# Patient Record
Sex: Male | Born: 1950
Health system: Southern US, Community
[De-identification: ages and names within clinical notes are randomized; demographics above are authoritative.]

## PROBLEM LIST (undated history)

## (undated) DIAGNOSIS — M19011 Primary osteoarthritis, right shoulder: Secondary | ICD-10-CM

## (undated) DIAGNOSIS — J189 Pneumonia, unspecified organism: Secondary | ICD-10-CM

## (undated) DIAGNOSIS — R51 Headache: Secondary | ICD-10-CM

## (undated) DIAGNOSIS — M1990 Unspecified osteoarthritis, unspecified site: Secondary | ICD-10-CM

## (undated) DIAGNOSIS — K219 Gastro-esophageal reflux disease without esophagitis: Secondary | ICD-10-CM

## (undated) DIAGNOSIS — J40 Bronchitis, not specified as acute or chronic: Secondary | ICD-10-CM

## (undated) DIAGNOSIS — Z974 Presence of external hearing-aid: Secondary | ICD-10-CM

## (undated) DIAGNOSIS — G479 Sleep disorder, unspecified: Secondary | ICD-10-CM

## (undated) DIAGNOSIS — G4730 Sleep apnea, unspecified: Secondary | ICD-10-CM

## (undated) DIAGNOSIS — I10 Essential (primary) hypertension: Secondary | ICD-10-CM

## (undated) DIAGNOSIS — C801 Malignant (primary) neoplasm, unspecified: Secondary | ICD-10-CM

## (undated) DIAGNOSIS — Z9289 Personal history of other medical treatment: Secondary | ICD-10-CM

## (undated) DIAGNOSIS — H9190 Unspecified hearing loss, unspecified ear: Secondary | ICD-10-CM

## (undated) HISTORY — PX: CERVICAL DISC SURGERY: SHX588

## (undated) HISTORY — PX: TONSILLECTOMY: SUR1361

## (undated) HISTORY — PX: EYE SURGERY: SHX253

## (undated) HISTORY — PX: HERNIA REPAIR: SHX51

---

## 1998-02-12 DIAGNOSIS — Z9289 Personal history of other medical treatment: Secondary | ICD-10-CM

## 1998-02-12 HISTORY — DX: Personal history of other medical treatment: Z92.89

## 1998-05-16 ENCOUNTER — Ambulatory Visit (HOSPITAL_COMMUNITY): Admission: RE | Admit: 1998-05-16 | Discharge: 1998-05-16 | Payer: Self-pay | Admitting: Internal Medicine

## 1998-05-16 ENCOUNTER — Encounter: Payer: Self-pay | Admitting: Internal Medicine

## 2000-07-19 ENCOUNTER — Ambulatory Visit (HOSPITAL_COMMUNITY): Admission: RE | Admit: 2000-07-19 | Discharge: 2000-07-19 | Payer: Self-pay | Admitting: Gastroenterology

## 2002-07-21 ENCOUNTER — Encounter: Admission: RE | Admit: 2002-07-21 | Discharge: 2002-07-21 | Payer: Self-pay | Admitting: Internal Medicine

## 2002-07-21 ENCOUNTER — Encounter: Payer: Self-pay | Admitting: Internal Medicine

## 2003-11-25 ENCOUNTER — Encounter (INDEPENDENT_AMBULATORY_CARE_PROVIDER_SITE_OTHER): Payer: Self-pay | Admitting: Specialist

## 2003-11-25 ENCOUNTER — Ambulatory Visit (HOSPITAL_COMMUNITY): Admission: RE | Admit: 2003-11-25 | Discharge: 2003-11-25 | Payer: Self-pay | Admitting: General Surgery

## 2003-11-25 ENCOUNTER — Ambulatory Visit (HOSPITAL_BASED_OUTPATIENT_CLINIC_OR_DEPARTMENT_OTHER): Admission: RE | Admit: 2003-11-25 | Discharge: 2003-11-25 | Payer: Self-pay | Admitting: General Surgery

## 2004-02-13 DIAGNOSIS — G4730 Sleep apnea, unspecified: Secondary | ICD-10-CM

## 2004-02-13 HISTORY — DX: Sleep apnea, unspecified: G47.30

## 2007-02-13 HISTORY — PX: SHOULDER ARTHROSCOPY: SHX128

## 2008-02-13 HISTORY — PX: SHOULDER ARTHROSCOPY: SHX128

## 2008-09-20 ENCOUNTER — Ambulatory Visit (HOSPITAL_BASED_OUTPATIENT_CLINIC_OR_DEPARTMENT_OTHER): Admission: RE | Admit: 2008-09-20 | Discharge: 2008-09-20 | Payer: Self-pay | Admitting: Plastic Surgery

## 2008-09-20 ENCOUNTER — Encounter (INDEPENDENT_AMBULATORY_CARE_PROVIDER_SITE_OTHER): Payer: Self-pay | Admitting: Plastic Surgery

## 2009-02-12 HISTORY — PX: CATARACT EXTRACTION: SUR2

## 2009-04-21 ENCOUNTER — Inpatient Hospital Stay (HOSPITAL_COMMUNITY): Admission: RE | Admit: 2009-04-21 | Discharge: 2009-04-22 | Payer: Self-pay | Admitting: *Deleted

## 2010-05-08 LAB — DIFFERENTIAL
Basophils Absolute: 0 10*3/uL (ref 0.0–0.1)
Eosinophils Absolute: 0.1 10*3/uL (ref 0.0–0.7)
Monocytes Absolute: 0.3 10*3/uL (ref 0.1–1.0)
Monocytes Relative: 9 % (ref 3–12)

## 2010-05-08 LAB — COMPREHENSIVE METABOLIC PANEL
ALT: 39 U/L (ref 0–53)
AST: 33 U/L (ref 0–37)
Albumin: 4.3 g/dL (ref 3.5–5.2)
BUN: 25 mg/dL — ABNORMAL HIGH (ref 6–23)
Calcium: 9.5 mg/dL (ref 8.4–10.5)
Creatinine, Ser: 1.22 mg/dL (ref 0.4–1.5)
Total Protein: 6.7 g/dL (ref 6.0–8.3)

## 2010-05-08 LAB — URINALYSIS, ROUTINE W REFLEX MICROSCOPIC
Glucose, UA: NEGATIVE mg/dL
Hgb urine dipstick: NEGATIVE
Protein, ur: NEGATIVE mg/dL
Urobilinogen, UA: 1 mg/dL (ref 0.0–1.0)

## 2010-05-08 LAB — TYPE AND SCREEN

## 2010-05-08 LAB — CBC
Hemoglobin: 15.9 g/dL (ref 13.0–17.0)
MCHC: 34.8 g/dL (ref 30.0–36.0)
MCV: 93.2 fL (ref 78.0–100.0)
RDW: 12.3 % (ref 11.5–15.5)

## 2010-05-20 LAB — BASIC METABOLIC PANEL
BUN: 24 mg/dL — ABNORMAL HIGH (ref 6–23)
Calcium: 9.9 mg/dL (ref 8.4–10.5)
Chloride: 104 mEq/L (ref 96–112)
Creatinine, Ser: 1.51 mg/dL — ABNORMAL HIGH (ref 0.4–1.5)
GFR calc non Af Amer: 48 mL/min — ABNORMAL LOW (ref >60)
Glucose, Bld: 110 mg/dL — ABNORMAL HIGH (ref 70–99)
Potassium: 4.1 mEq/L (ref 3.5–5.1)
Sodium: 138 mEq/L (ref 135–145)

## 2010-05-20 LAB — POCT HEMOGLOBIN-HEMACUE: Hemoglobin: 15.7 g/dL (ref 13.0–17.0)

## 2010-06-27 NOTE — Op Note (Signed)
NAME:  Travis Arnold, Travis Arnold                ACCOUNT NO.:  [id number]   MEDICAL RECORD NO.:  [id number]          PATIENT TYPE:  AMB   LOCATION:  DSC                          FACILITY:  [facility]   PHYSICIAN:  Loreta Ave, MD DATE OF BIRTH:  [birth year]   DATE OF PROCEDURE:  09/20/2008  DATE OF DISCHARGE:                               OPERATIVE REPORT   PREOPERATIVE DIAGNOSIS:  Basal cell carcinoma, left forehead.   POSTOPERATIVE DIAGNOSIS:  Basal cell carcinoma, left forehead.   PROCEDURE PERFORMED:  Excision of left brow basal cell carcinoma with  complex closure.   SURGEON:  Loreta Ave, MD   ASSISTANT:  None.   SPECIMENS:  Left forehead lesion to pathology.   ESTIMATED BLOOD LOSS:  Minimal.   COMPLICATIONS:  None.   DRAINS:  None.   CLINICAL INDICATIONS:  Travis Arnold is a 60 year old Caucasian male who  has a biopsy-proven basal cell carcinoma of the left brow.  He presents  at this time for definitive wide local excision.   Travis Arnold understands the risks of surgery to include bleeding, infection,  damage to the nearby structures including the temporal artery and the  frontal branch of the seventh cranial nerve.  He also understands that  this tumor may recur and margins may be positive upon initial resection.  He desires to proceed with surgical excision.   DESCRIPTION OF OPERATION:  The patient was brought to the operating  room, placed in the supine position on the operating room table.  After  smooth and routine induction of general anesthesia with laryngeal mask  airway, the patient's left face and forehead were prepped with Betadine  and draped into a sterile field.  The basal cell carcinoma was measured  as being 1.3 x 1.0 cm.  The 4 mm margins were designed around this to  excise lesion 2.1 x 1.8 cm.  A fusiform shape was designed over the  lateral aspect of the left brow parallel to the relaxed skin tension  lines of the brow.  The skin was incised with a 15  blade and the skin  was elevated off of the subcutaneous tissues with a 15 blade.  Care was  taken not to injure any deeper structures.  The lesion was marked with a  silk suture and passed off the table labeled left brow lesion.  Next,  the wound edges were differentially undermined.   The superior and lateral skin flaps were elevated with blunt dissection  for 5-6 cm moving superiorly.  This was done to allow descent of the  superior brow without elevating the lateral canthus or the eyebrow.  Next, the wound was irrigated with normal saline.  Next, 8 mL of 0.5%  bupivacaine with 1:100,000 epinephrine were used for postoperative  analgesia.  Hemostasis was verified with bipolar electrocautery.  The  wound was then closed with  buried, interrupted 5-0 Monocryl sutures and a running 6-0 Prolene  stitch.  Polysporin was applied to the incision.  The patient was then  extubated and transferred to the recovery room in stable condition.  Sponge and all counts were reported  as correct.      Loreta Ave, MD  Electronically Signed     Loreta Ave, MD  Electronically Signed    CF/MEDQ  D:  09/20/2008  T:  09/21/2008  Job:  [id number]

## 2010-06-30 NOTE — Op Note (Signed)
NAME:  Travis Arnold, RADEMAKER                ACCOUNT NO.:  [id number]   MEDICAL RECORD NO.:  [id number]          PATIENT TYPE:  AMB   LOCATION:  DSC                          FACILITY:  [facility]   PHYSICIAN:  Ollen Gross. Vernell Morgans, M.D. DATE OF BIRTH:  Apr 10, 1950   DATE OF PROCEDURE:  11/29/2003  DATE OF DISCHARGE:  11/25/2003                                 OPERATIVE REPORT   PREOPERATIVE DIAGNOSES:  Skin lesion on the left forearm and upper back.   POSTOPERATIVE DIAGNOSES:  Skin lesion on the left forearm and upper back.   OPERATION PERFORMED:  Excision of 2 mm lesion from left forearm and 2 cm  lesion from upper back.   SURGEON:  Ollen Gross. Carolynne Edouard, M.D.   ANESTHESIA:  Local.   DESCRIPTION OF PROCEDURE:  After informed consent was obtained, the patient  was brought to the operating room and placed in supine position on the  operating table.  The patient's left arm was then prepped with Betadine and  draped in the usual sterile manner.  The area in question was infiltrated  with 1% lidocaine with epinephrine.  The skin lesion was approximately 2 mm  and was excised in elliptical fashion with a longitudinal type incision.  Once the pass was excised sharply with a 15 blade knife, the incision was  closed with one interrupted 5-0 Monocryl subcuticular stitch.  Benzoin,  Steri-Strips and sterile dressings were applied.  Next, the patient was  moved into a prone position and the area on his upper back was the prepped  with Betadine and draped in the usual sterile manner.  The area was  infiltrated with 1% lidocaine until a good field block was created.  The  area was then excised in an elliptical fashion transversely until the entire  lesion was removed.  This was done sharply full thickness with a 15 blade  knife. Once the lesion was removed, it was oriented and sent to pathology  for further evaluation.  The defect was quite large and the skin edges were  undermined slightly with Metzenbaum scissors.  The  wound was then able to be  closed with a combination of 2-0 nylon vertical mattress stitches as well as  2-0 nylon simple interrupted stitches.  Once this was done, the wound was  coated with Neosporin and sterile dressings were applied.  The patient  tolerated the procedure well.  Hemostasis was achieved using the eye  cautery.  The patient was then taken to recovery room in stable condition.       PST/MEDQ  D:  11/29/2003  T:  11/29/2003  Job:  [id number]

## 2010-11-07 ENCOUNTER — Other Ambulatory Visit: Payer: Self-pay | Admitting: Gastroenterology

## 2010-11-07 DIAGNOSIS — R1012 Left upper quadrant pain: Secondary | ICD-10-CM

## 2010-11-09 ENCOUNTER — Ambulatory Visit: Admission: RE | Admit: 2010-11-09 | Payer: Medicare Other | Source: Ambulatory Visit

## 2010-11-09 ENCOUNTER — Ambulatory Visit
Admission: RE | Admit: 2010-11-09 | Discharge: 2010-11-09 | Disposition: A | Discharge status: Home / Self Care | Payer: Medicare Other | Source: Ambulatory Visit | Attending: Gastroenterology | Admitting: Gastroenterology

## 2010-11-09 ENCOUNTER — Other Ambulatory Visit: Payer: Self-pay | Admitting: Gastroenterology

## 2010-11-09 DIAGNOSIS — R1012 Left upper quadrant pain: Secondary | ICD-10-CM

## 2010-11-09 MED ORDER — IOHEXOL 300 MG/ML  SOLN: 100.0000 mL | Freq: Once | INTRAMUSCULAR | Status: AC | PRN

## 2012-05-13 ENCOUNTER — Other Ambulatory Visit: Payer: Self-pay | Admitting: Orthopedic Surgery

## 2012-05-14 ENCOUNTER — Other Ambulatory Visit: Payer: Self-pay | Admitting: Orthopedic Surgery

## 2012-05-14 DIAGNOSIS — M542 Cervicalgia: Secondary | ICD-10-CM

## 2012-05-30 ENCOUNTER — Ambulatory Visit
Admission: RE | Admit: 2012-05-30 | Discharge: 2012-05-30 | Disposition: A | Discharge status: Home / Self Care | Payer: 59 | Source: Ambulatory Visit | Attending: Orthopedic Surgery | Admitting: Orthopedic Surgery

## 2012-05-30 VITALS — BP 147/99 | HR 68

## 2012-05-30 DIAGNOSIS — M542 Cervicalgia: Secondary | ICD-10-CM

## 2012-05-30 MED ORDER — DIAZEPAM 5 MG PO TABS
10.0000 mg | ORAL_TABLET | Freq: Once | ORAL | Status: AC
  Administered 2012-05-30: 10 mg via ORAL

## 2012-05-30 MED ORDER — IOHEXOL 300 MG/ML  SOLN
10.0000 mL | Freq: Once | INTRAMUSCULAR | Status: AC | PRN
  Administered 2012-05-30: 10 mL via INTRATHECAL

## 2012-05-30 NOTE — Progress Notes (Addendum)
Patient states he has been off Tramadol (and all his other medications) since "last Wednesday."  Willaim Sheng here to interpret/sign discharge instructions.  Donell Sievert, RN

## 2012-06-12 ENCOUNTER — Other Ambulatory Visit: Payer: Self-pay | Admitting: Dermatology

## 2012-08-28 ENCOUNTER — Other Ambulatory Visit: Payer: Self-pay | Admitting: Orthopedic Surgery

## 2012-09-08 ENCOUNTER — Encounter (HOSPITAL_COMMUNITY): Payer: Self-pay | Admitting: Pharmacy Technician

## 2012-09-09 ENCOUNTER — Other Ambulatory Visit: Payer: Self-pay | Admitting: Orthopedic Surgery

## 2012-09-09 NOTE — Pre-Procedure Instructions (Addendum)
Travis Arnold  09/09/2012   Your procedure is scheduled on:  8.6.14  Report to [facility] at 630 AM.  Call this number if you have problems the morning of surgery: [phone]   Remember:   Do not eat food or drink liquids after midnight.   Take these medicines the morning of surgery with A SIP OF WATER: metoprolol, nexium, proscar,pain med,              STOP  Aspirin, meloxicam on 09/12/12   Do not wear jewelry, make-up or nail polish.  Do not wear lotions, powders, or perfumes. You may wear deodorant.  Do not shave 48 hours prior to surgery. Men may shave face and neck.  Do not bring valuables to the hospital.  [facility] is not responsible                   for any belongings or valuables.  Contacts, dentures or bridgework may not be worn into surgery.  Leave suitcase in the car. After surgery it may be brought to your room.  For patients admitted to the hospital, checkout time is 11:00 AM the day of  discharge.   Patients discharged the day of surgery will not be allowed to drive  home.  Name and phone number of your driver:   Special Instructions: Incentive Spirometry - Practice and bring it with you on the day of surgery. Shower using CHG 2 nights before surgery and the night before surgery.  If you shower the day of surgery use CHG.  Use special wash - you have one bottle of CHG for all showers.  You should use approximately 1/3 of the bottle for each shower.   Please read over the following fact sheets that you were given: Pain Booklet, Coughing and Deep Breathing, Blood Transfusion Information, MRSA Information and Surgical Site Infection Prevention

## 2012-09-10 ENCOUNTER — Other Ambulatory Visit: Payer: Self-pay | Admitting: Orthopedic Surgery

## 2012-09-10 ENCOUNTER — Encounter (HOSPITAL_COMMUNITY)
Admission: RE | Admit: 2012-09-10 | Discharge: 2012-09-10 | Disposition: A | Discharge status: Home / Self Care | Payer: Medicare Other | Source: Ambulatory Visit | Attending: Orthopedic Surgery | Admitting: Orthopedic Surgery

## 2012-09-10 ENCOUNTER — Ambulatory Visit (HOSPITAL_COMMUNITY)
Admission: RE | Admit: 2012-09-10 | Discharge: 2012-09-10 | Disposition: A | Discharge status: Home / Self Care | Payer: Medicare Other | Source: Ambulatory Visit | Attending: Orthopedic Surgery | Admitting: Orthopedic Surgery

## 2012-09-10 ENCOUNTER — Encounter (HOSPITAL_COMMUNITY): Payer: Self-pay

## 2012-09-10 DIAGNOSIS — Z01818 Encounter for other preprocedural examination: Secondary | ICD-10-CM | POA: Insufficient documentation

## 2012-09-10 DIAGNOSIS — Z01810 Encounter for preprocedural cardiovascular examination: Secondary | ICD-10-CM | POA: Insufficient documentation

## 2012-09-10 DIAGNOSIS — Z01812 Encounter for preprocedural laboratory examination: Secondary | ICD-10-CM | POA: Insufficient documentation

## 2012-09-10 DIAGNOSIS — J189 Pneumonia, unspecified organism: Secondary | ICD-10-CM | POA: Insufficient documentation

## 2012-09-10 HISTORY — DX: Malignant (primary) neoplasm, unspecified: C80.1

## 2012-09-10 HISTORY — DX: Gastro-esophageal reflux disease without esophagitis: K21.9

## 2012-09-10 HISTORY — DX: Unspecified osteoarthritis, unspecified site: M19.90

## 2012-09-10 HISTORY — DX: Bronchitis, not specified as acute or chronic: J40

## 2012-09-10 HISTORY — DX: Sleep apnea, unspecified: G47.30

## 2012-09-10 HISTORY — DX: Essential (primary) hypertension: I10

## 2012-09-10 LAB — PROTIME-INR: Prothrombin Time: 12.8 seconds (ref 11.6–15.2)

## 2012-09-10 LAB — CBC WITH DIFFERENTIAL/PLATELET
Hemoglobin: 16.1 g/dL (ref 13.0–17.0)
Lymphs Abs: 1.2 10*3/uL (ref 0.7–4.0)
Monocytes Relative: 8 % (ref 3–12)
Neutro Abs: 2.7 10*3/uL (ref 1.7–7.7)
Neutrophils Relative %: 59 % (ref 43–77)
RBC: 4.85 MIL/uL (ref 4.22–5.81)

## 2012-09-10 LAB — COMPREHENSIVE METABOLIC PANEL
Albumin: 4 g/dL (ref 3.5–5.2)
Alkaline Phosphatase: 87 U/L (ref 39–117)
BUN: 24 mg/dL — ABNORMAL HIGH (ref 6–23)
Chloride: 101 mEq/L (ref 96–112)
GFR calc Af Amer: 77 mL/min — ABNORMAL LOW (ref >90)
Glucose, Bld: 94 mg/dL (ref 70–99)
Potassium: 3.9 mEq/L (ref 3.5–5.1)
Total Bilirubin: 0.7 mg/dL (ref 0.3–1.2)

## 2012-09-10 LAB — APTT: aPTT: 26 seconds (ref 24–37)

## 2012-09-10 LAB — URINALYSIS, ROUTINE W REFLEX MICROSCOPIC
Bilirubin Urine: NEGATIVE
Glucose, UA: NEGATIVE mg/dL
Hgb urine dipstick: NEGATIVE
Specific Gravity, Urine: 1.021 (ref 1.005–1.030)
Urobilinogen, UA: 1 mg/dL (ref 0.0–1.0)

## 2012-09-10 LAB — SURGICAL PCR SCREEN: Staphylococcus aureus: NEGATIVE

## 2012-09-10 NOTE — Progress Notes (Signed)
Patient req typ and scn dos. Does not want to wear bracelet. Patient and wife are hearing impaired. Has sign interpretor arranged. expressed concern that wife be with pt as long as possible to assist with communication.

## 2012-09-10 NOTE — Progress Notes (Addendum)
Travis Arnold at dr hewitt's office called with cxr report of bibasilar pneumonias. She stated she will notify dr Yevette Edwards

## 2012-09-17 ENCOUNTER — Encounter (HOSPITAL_COMMUNITY): Admission: RE | Payer: Self-pay | Source: Ambulatory Visit

## 2012-09-17 ENCOUNTER — Inpatient Hospital Stay (HOSPITAL_COMMUNITY): Admission: RE | Admit: 2012-09-17 | Payer: Medicare Other | Source: Ambulatory Visit | Admitting: Orthopedic Surgery

## 2012-09-17 SURGERY — POSTERIOR CERVICAL FUSION/FORAMINOTOMY LEVEL 5
Anesthesia: General

## 2013-01-13 ENCOUNTER — Other Ambulatory Visit: Payer: Self-pay | Admitting: Orthopedic Surgery

## 2013-01-14 ENCOUNTER — Other Ambulatory Visit: Payer: Self-pay | Admitting: Orthopedic Surgery

## 2013-02-04 NOTE — Pre-Procedure Instructions (Signed)
Travis Arnold  02/04/2013   Your procedure is scheduled on: Wednesday  02/19/12   Report to [facility]  2 * 3 at 630 AM.  Call this number if you have problems the morning of surgery: [phone]   Remember:   Do not eat food or drink liquids after midnight.   Take these medicines the morning of surgery with A SIP OF WATER: doxazosin(cardura), nexium, finasteride, metoprolol(lopressor), tramadol (ultram)   Do not wear jewelry  Do not wear lotions, powders, or perfumes. You may wear deodorant.   Men may shave face and neck.  Do not bring valuables to the hospital.  [facility] is not responsible                  for any belongings or valuables.               Contacts, dentures or bridgework may not be worn into surgery.  Leave suitcase in the car. After surgery it may be brought to your room.  For patients admitted to the hospital, discharge time is determined by your                treatment team.               Patients discharged the day of surgery will not be allowed to drive  home.  Name and phone number of your driver: wife - hearing impaired as well   Special Instructions: Shower using CHG 2 nights before surgery and the night before surgery.  If you shower the day of surgery use CHG.  Use special wash - you have one bottle of CHG for all showers.  You should use approximately 1/3 of the bottle for each shower.   Please read over the following fact sheets that you were given: Pain Booklet, Coughing and Deep Breathing and Surgical Site Infection Prevention

## 2013-02-06 ENCOUNTER — Encounter (HOSPITAL_COMMUNITY): Payer: Self-pay

## 2013-02-06 ENCOUNTER — Other Ambulatory Visit: Payer: Self-pay | Admitting: Orthopedic Surgery

## 2013-02-06 ENCOUNTER — Encounter (HOSPITAL_COMMUNITY)
Admission: RE | Admit: 2013-02-06 | Discharge: 2013-02-06 | Disposition: A | Discharge status: Home / Self Care | Payer: Medicare Other | Source: Ambulatory Visit | Attending: Orthopedic Surgery | Admitting: Orthopedic Surgery

## 2013-02-06 DIAGNOSIS — Z01818 Encounter for other preprocedural examination: Secondary | ICD-10-CM | POA: Insufficient documentation

## 2013-02-06 DIAGNOSIS — Z01812 Encounter for preprocedural laboratory examination: Secondary | ICD-10-CM | POA: Insufficient documentation

## 2013-02-06 HISTORY — DX: Sleep disorder, unspecified: G47.9

## 2013-02-06 HISTORY — DX: Headache: R51

## 2013-02-06 HISTORY — DX: Personal history of other medical treatment: Z92.89

## 2013-02-06 LAB — PROTIME-INR: INR: 0.99 (ref 0.00–1.49)

## 2013-02-06 LAB — COMPREHENSIVE METABOLIC PANEL
Albumin: 4 g/dL (ref 3.5–5.2)
Alkaline Phosphatase: 101 U/L (ref 39–117)
BUN: 24 mg/dL — ABNORMAL HIGH (ref 6–23)
CO2: 30 mEq/L (ref 19–32)
Calcium: 9.4 mg/dL (ref 8.4–10.5)
Chloride: 95 mEq/L — ABNORMAL LOW (ref 96–112)
Creatinine, Ser: 0.94 mg/dL (ref 0.50–1.35)
GFR calc Af Amer: >90 mL/min (ref >90)
GFR calc non Af Amer: 87 mL/min — ABNORMAL LOW (ref >90)
Glucose, Bld: 105 mg/dL — ABNORMAL HIGH (ref 70–99)
Total Bilirubin: 0.9 mg/dL (ref 0.3–1.2)

## 2013-02-06 LAB — CBC WITH DIFFERENTIAL/PLATELET
Eosinophils Relative: 4 % (ref 0–5)
HCT: 46.9 % (ref 39.0–52.0)
Hemoglobin: 17.1 g/dL — ABNORMAL HIGH (ref 13.0–17.0)
Lymphocytes Relative: 27 % (ref 12–46)
Lymphs Abs: 1.1 10*3/uL (ref 0.7–4.0)
MCV: 91.2 fL (ref 78.0–100.0)
Monocytes Absolute: 0.6 10*3/uL (ref 0.1–1.0)
Monocytes Relative: 15 % — ABNORMAL HIGH (ref 3–12)
RBC: 5.14 MIL/uL (ref 4.22–5.81)
RDW: 11.6 % (ref 11.5–15.5)
WBC: 4 10*3/uL (ref 4.0–10.5)

## 2013-02-06 LAB — URINALYSIS, ROUTINE W REFLEX MICROSCOPIC
Glucose, UA: NEGATIVE mg/dL
Hgb urine dipstick: NEGATIVE
Ketones, ur: NEGATIVE mg/dL
Leukocytes, UA: NEGATIVE
Nitrite: NEGATIVE
Protein, ur: NEGATIVE mg/dL
Specific Gravity, Urine: 1.025 (ref 1.005–1.030)
Urobilinogen, UA: 1 mg/dL (ref 0.0–1.0)
pH: 6.5 (ref 5.0–8.0)

## 2013-02-06 LAB — SURGICAL PCR SCREEN: MRSA, PCR: NEGATIVE

## 2013-02-06 LAB — TYPE AND SCREEN: ABO/RH(D): O POS

## 2013-02-06 NOTE — Progress Notes (Signed)
02/06/13 1206  OBSTRUCTIVE SLEEP APNEA  Have you ever been diagnosed with sleep apnea through a sleep study? Yes  If yes, do you have and use a CPAP or BPAP machine every night? 0  Do you snore loudly (loud enough to be heard through closed doors)?  1  Do you often feel tired, fatigued, or sleepy during the daytime? 1  Has anyone observed you stop breathing during your sleep? 1  Do you have, or are you being treated for high blood pressure? 1  BMI more than 35 kg/m2? 0  Age over 75 years old? 1  Neck circumference greater than 40 cm/18 inches? 0  Gender: 1  Obstructive Sleep Apnea Score 6  Score 4 or greater  Results sent to PCP

## 2013-02-06 NOTE — Progress Notes (Signed)
Pt. & wife very concerned about their hearing impaired needs being met while pt. Is in patient. They recall a previous experience with inpt. admission, where the nurse couldn't get pt. to wake from a sleep state by audible calling his name, so their recall is that the nurse shook him in the neck, head, shoulder region & he felt that this led to further pain & problems in the neck & shoulder area.   Pt. & spouse verbalizing through interpretor present today, that they need better support than they have experienced in past. Call to care management, no answer, will follow up on Monday- 12/29.

## 2013-02-06 NOTE — Progress Notes (Signed)
Pt. With interpretor, is hearing impaired. Call to C. Katrinka Blazing, left voicemail,  at [employer], asked for operative consent to clarify level of Thoracic  .  Pt. Reports that he has recently had low grade temp, feels flulike, but is feeling better today.

## 2013-02-09 ENCOUNTER — Encounter (HOSPITAL_COMMUNITY): Payer: Self-pay

## 2013-02-09 NOTE — Progress Notes (Signed)
Anesthesia chart review:  Patient is a 62 year old male scheduled for posterior cervical fusion C3-4, C4-5, C5-6, C6-7, C7-T1 on 02/18/13 by Dr. Yevette Edwards.  History includes non-smoker, hearing impaired, HTN, GERD, OSA without current CPAP, arthritis, headaches, tonsillectomy, melanoma, left forehead BCC excision '10, bilateral inguinal hernia repair, C3-5 ACDF '11. PCP is listed as Dr. Kirby Funk.  EKG on 09/10/12 showed SB at 55 bpm, PACs, RSR prime or QR pattern in V1 suggests RV conduction delay.  CHEST 2 VIEW 02/06/13: COMPARISON: 09/10/2012  FINDINGS: The cardiac silhouette remains mildly enlarged. The patient has taken a greater inspiration than on the prior study. There remains mild vascular crowding at the lung bases, however there is improved aeration at the left base with interval resolution of previously described opacity. There is no evidence of new airspace consolidation, edema, pleural effusion, or pneumothorax. No acute osseous abnormality is identified. IMPRESSION: Improved lung inflation with mild residual bibasilar atelectasis.   Preoperative labs noted.  Chart left for Short Stay RN follow-up regarding need for interpretor due to be hearing impaired. Apparently, patient had interpretor with him at his PAT visit, but requested interpretor be arranged while in-patient.  Staff to notify case management.  Also they will need to clarify if he will also need an interpretor pre-operatively so this can be arranged if needed.  If so, they were asked to contact our scheduler Dondra Spry so she can arrange.  Velna Ochs [facility]/Anesthesiology Phone [phone] 02/09/2013 1:23 PM

## 2013-02-10 NOTE — Progress Notes (Signed)
Spoke to [employer] in Office of Inclusion (set up interpretors for [facility] patients).Stated the day of surgery for Travis Arnold a sign language interpretor will arrive at 0630 and will also be available that afternoon starting at 1345 for 2 hours. She stated that the nurses on the floor are aware of making arrangements for interpretors when doctors make rounds. All they have to do is call [employer] [phone] and after hours they can use the sign language emergency number which is [phone].

## 2013-02-17 MED ORDER — CEFAZOLIN SODIUM-DEXTROSE 2-3 GM-% IV SOLR
2.0000 g | INTRAVENOUS | Status: AC
Start: 2013-02-18 — End: 2013-02-18
  Administered 2013-02-18 (×2): 2 g via INTRAVENOUS
  Filled 2013-02-17: qty 50

## 2013-02-18 ENCOUNTER — Inpatient Hospital Stay (HOSPITAL_COMMUNITY): Payer: Medicare Other | Admitting: Anesthesiology

## 2013-02-18 ENCOUNTER — Inpatient Hospital Stay (HOSPITAL_COMMUNITY)
Admission: RE | Admit: 2013-02-18 | Discharge: 2013-02-20 | DRG: 473 | Disposition: A | Discharge status: Home / Self Care | Payer: Medicare Other | Source: Ambulatory Visit | Attending: Orthopedic Surgery | Admitting: Orthopedic Surgery

## 2013-02-18 ENCOUNTER — Encounter (HOSPITAL_COMMUNITY)
Admission: RE | Disposition: A | Discharge status: Home / Self Care | Payer: Medicare Other | Source: Ambulatory Visit | Attending: Orthopedic Surgery

## 2013-02-18 ENCOUNTER — Encounter (HOSPITAL_COMMUNITY): Payer: Medicare Other | Admitting: Vascular Surgery

## 2013-02-18 ENCOUNTER — Inpatient Hospital Stay (HOSPITAL_COMMUNITY): Payer: Medicare Other

## 2013-02-18 ENCOUNTER — Encounter (HOSPITAL_COMMUNITY): Payer: Self-pay | Admitting: Surgery

## 2013-02-18 DIAGNOSIS — M5410 Radiculopathy, site unspecified: Secondary | ICD-10-CM | POA: Diagnosis present

## 2013-02-18 DIAGNOSIS — M47812 Spondylosis without myelopathy or radiculopathy, cervical region: Principal | ICD-10-CM | POA: Diagnosis present

## 2013-02-18 DIAGNOSIS — I10 Essential (primary) hypertension: Secondary | ICD-10-CM | POA: Diagnosis present

## 2013-02-18 DIAGNOSIS — K219 Gastro-esophageal reflux disease without esophagitis: Secondary | ICD-10-CM | POA: Diagnosis present

## 2013-02-18 HISTORY — PX: POSTERIOR CERVICAL FUSION/FORAMINOTOMY: SHX5038

## 2013-02-18 SURGERY — POSTERIOR CERVICAL FUSION/FORAMINOTOMY LEVEL 5
Anesthesia: General | Site: Spine Cervical

## 2013-02-18 MED ORDER — PROPOFOL INFUSION 10 MG/ML OPTIME
INTRAVENOUS | Status: DC | PRN
  Administered 2013-02-18: 25 ug/kg/min via INTRAVENOUS

## 2013-02-18 MED ORDER — MENTHOL 3 MG MT LOZG: 1.0000 | LOZENGE | OROMUCOSAL | Status: DC | PRN

## 2013-02-18 MED ORDER — BUPIVACAINE-EPINEPHRINE (PF) 0.25% -1:200000 IJ SOLN
INTRAMUSCULAR | Status: AC
  Filled 2013-02-18: qty 30

## 2013-02-18 MED ORDER — FINASTERIDE 5 MG PO TABS
5.0000 mg | ORAL_TABLET | Freq: Every morning | ORAL | Status: DC
  Administered 2013-02-19 – 2013-02-20 (×2): 5 mg via ORAL
  Filled 2013-02-18 (×2): qty 1

## 2013-02-18 MED ORDER — OXYCODONE HCL 5 MG PO TABS: 5.0000 mg | ORAL_TABLET | Freq: Once | ORAL | Status: DC | PRN

## 2013-02-18 MED ORDER — ACETAMINOPHEN 650 MG RE SUPP: 650.0000 mg | RECTAL | Status: DC | PRN

## 2013-02-18 MED ORDER — ACETAMINOPHEN 500 MG PO TABS: 1000.0000 mg | ORAL_TABLET | Freq: Every day | ORAL | Status: DC | PRN

## 2013-02-18 MED ORDER — SODIUM CHLORIDE 0.9 % IJ SOLN: 3.0000 mL | INTRAMUSCULAR | Status: DC | PRN

## 2013-02-18 MED ORDER — 0.9 % SODIUM CHLORIDE (POUR BTL) OPTIME
TOPICAL | Status: DC | PRN
  Administered 2013-02-18 (×4): 1000 mL

## 2013-02-18 MED ORDER — FLEET ENEMA 7-19 GM/118ML RE ENEM: 1.0000 | ENEMA | Freq: Once | RECTAL | Status: AC | PRN

## 2013-02-18 MED ORDER — MECLIZINE HCL 25 MG PO TABS
25.0000 mg | ORAL_TABLET | Freq: Three times a day (TID) | ORAL | Status: DC | PRN
  Filled 2013-02-18: qty 1

## 2013-02-18 MED ORDER — SENNOSIDES-DOCUSATE SODIUM 8.6-50 MG PO TABS: 1.0000 | ORAL_TABLET | Freq: Every evening | ORAL | Status: DC | PRN

## 2013-02-18 MED ORDER — ACETAMINOPHEN 325 MG PO TABS: 650.0000 mg | ORAL_TABLET | ORAL | Status: DC | PRN

## 2013-02-18 MED ORDER — THROMBIN 20000 UNITS EX SOLR
CUTANEOUS | Status: DC | PRN
  Administered 2013-02-18: 11:00:00

## 2013-02-18 MED ORDER — HYDROMORPHONE HCL PF 1 MG/ML IJ SOLN
INTRAMUSCULAR | Status: AC
  Filled 2013-02-18: qty 1

## 2013-02-18 MED ORDER — DIAZEPAM 5 MG PO TABS
5.0000 mg | ORAL_TABLET | Freq: Four times a day (QID) | ORAL | Status: DC | PRN
  Administered 2013-02-18 – 2013-02-20 (×6): 5 mg via ORAL
  Filled 2013-02-18 (×6): qty 1

## 2013-02-18 MED ORDER — SODIUM CHLORIDE 0.9 % IJ SOLN
3.0000 mL | Freq: Two times a day (BID) | INTRAMUSCULAR | Status: DC
  Administered 2013-02-18 – 2013-02-19 (×2): 3 mL via INTRAVENOUS

## 2013-02-18 MED ORDER — LACTATED RINGERS IV SOLN
INTRAVENOUS | Status: DC | PRN
  Administered 2013-02-18 (×2): via INTRAVENOUS

## 2013-02-18 MED ORDER — BACITRACIN ZINC 500 UNIT/GM EX OINT
TOPICAL_OINTMENT | CUTANEOUS | Status: AC
  Filled 2013-02-18: qty 15

## 2013-02-18 MED ORDER — SODIUM CHLORIDE 0.9 % IV SOLN
INTRAVENOUS | Status: DC
  Administered 2013-02-19: 02:00:00 via INTRAVENOUS

## 2013-02-18 MED ORDER — CEFAZOLIN SODIUM-DEXTROSE 2-3 GM-% IV SOLR
2.0000 g | INTRAVENOUS | Status: DC
  Filled 2013-02-18: qty 50

## 2013-02-18 MED ORDER — MIDAZOLAM HCL 5 MG/5ML IJ SOLN
INTRAMUSCULAR | Status: DC | PRN
  Administered 2013-02-18: 2 mg via INTRAVENOUS

## 2013-02-18 MED ORDER — ROCURONIUM BROMIDE 100 MG/10ML IV SOLN
INTRAVENOUS | Status: DC | PRN
  Administered 2013-02-18: 50 mg via INTRAVENOUS

## 2013-02-18 MED ORDER — LISINOPRIL-HYDROCHLOROTHIAZIDE 10-12.5 MG PO TABS: 1.0000 | ORAL_TABLET | ORAL | Status: DC

## 2013-02-18 MED ORDER — POVIDONE-IODINE 7.5 % EX SOLN: Freq: Once | CUTANEOUS | Status: DC

## 2013-02-18 MED ORDER — VECURONIUM BROMIDE 10 MG IV SOLR
INTRAVENOUS | Status: DC | PRN
  Administered 2013-02-18: 5 mg via INTRAVENOUS

## 2013-02-18 MED ORDER — ONDANSETRON HCL 4 MG/2ML IJ SOLN
INTRAMUSCULAR | Status: DC | PRN
  Administered 2013-02-18: 4 mg via INTRAVENOUS

## 2013-02-18 MED ORDER — NEOSTIGMINE METHYLSULFATE 1 MG/ML IJ SOLN
INTRAMUSCULAR | Status: DC | PRN
  Administered 2013-02-18: 3 mg via INTRAVENOUS

## 2013-02-18 MED ORDER — ATORVASTATIN CALCIUM 20 MG PO TABS
20.0000 mg | ORAL_TABLET | Freq: Every evening | ORAL | Status: DC
  Administered 2013-02-18 – 2013-02-19 (×2): 20 mg via ORAL
  Filled 2013-02-18 (×3): qty 1

## 2013-02-18 MED ORDER — PHENYLEPHRINE HCL 10 MG/ML IJ SOLN
10.0000 mg | INTRAMUSCULAR | Status: DC | PRN
  Administered 2013-02-18: 20 ug/min via INTRAVENOUS

## 2013-02-18 MED ORDER — PANTOPRAZOLE SODIUM 40 MG PO TBEC
80.0000 mg | DELAYED_RELEASE_TABLET | Freq: Every day | ORAL | Status: DC
  Administered 2013-02-19 – 2013-02-20 (×2): 80 mg via ORAL
  Filled 2013-02-18 (×2): qty 2

## 2013-02-18 MED ORDER — PROMETHAZINE HCL 25 MG/ML IJ SOLN: 6.2500 mg | INTRAMUSCULAR | Status: DC | PRN

## 2013-02-18 MED ORDER — ALBUMIN HUMAN 5 % IV SOLN
INTRAVENOUS | Status: DC | PRN
  Administered 2013-02-18 (×2): via INTRAVENOUS

## 2013-02-18 MED ORDER — OXYCODONE HCL 5 MG/5ML PO SOLN: 5.0000 mg | Freq: Once | ORAL | Status: DC | PRN

## 2013-02-18 MED ORDER — BISACODYL 5 MG PO TBEC: 5.0000 mg | DELAYED_RELEASE_TABLET | Freq: Every day | ORAL | Status: DC | PRN

## 2013-02-18 MED ORDER — DEXAMETHASONE SODIUM PHOSPHATE 4 MG/ML IJ SOLN
INTRAMUSCULAR | Status: DC | PRN
  Administered 2013-02-18: 8 mg via INTRAVENOUS

## 2013-02-18 MED ORDER — FENTANYL CITRATE 0.05 MG/ML IJ SOLN
INTRAMUSCULAR | Status: DC | PRN
  Administered 2013-02-18 (×2): 100 ug via INTRAVENOUS
  Administered 2013-02-18: 150 ug via INTRAVENOUS

## 2013-02-18 MED ORDER — METOPROLOL TARTRATE 50 MG PO TABS
50.0000 mg | ORAL_TABLET | Freq: Every morning | ORAL | Status: DC
  Administered 2013-02-19 – 2013-02-20 (×2): 50 mg via ORAL
  Filled 2013-02-18 (×2): qty 1

## 2013-02-18 MED ORDER — SODIUM CHLORIDE 0.9 % IV SOLN: 250.0000 mL | INTRAVENOUS | Status: DC

## 2013-02-18 MED ORDER — ONDANSETRON HCL 4 MG/2ML IJ SOLN: 4.0000 mg | INTRAMUSCULAR | Status: DC | PRN

## 2013-02-18 MED ORDER — LISINOPRIL 10 MG PO TABS
10.0000 mg | ORAL_TABLET | Freq: Every day | ORAL | Status: DC
  Administered 2013-02-19 – 2013-02-20 (×2): 10 mg via ORAL
  Filled 2013-02-18 (×2): qty 1

## 2013-02-18 MED ORDER — CEFAZOLIN SODIUM 1-5 GM-% IV SOLN
1.0000 g | Freq: Three times a day (TID) | INTRAVENOUS | Status: AC
  Administered 2013-02-18 – 2013-02-19 (×2): 1 g via INTRAVENOUS
  Filled 2013-02-18 (×2): qty 50

## 2013-02-18 MED ORDER — OXYCODONE-ACETAMINOPHEN 5-325 MG PO TABS
1.0000 | ORAL_TABLET | ORAL | Status: DC | PRN
  Administered 2013-02-18 – 2013-02-19 (×6): 2 via ORAL
  Administered 2013-02-20: 1 via ORAL
  Administered 2013-02-20 (×2): 2 via ORAL
  Administered 2013-02-20: 1 via ORAL
  Filled 2013-02-18 (×2): qty 2
  Filled 2013-02-18: qty 1
  Filled 2013-02-18: qty 2
  Filled 2013-02-18: qty 1
  Filled 2013-02-18 (×5): qty 2

## 2013-02-18 MED ORDER — THROMBIN 20000 UNITS EX SOLR
CUTANEOUS | Status: AC
  Filled 2013-02-18: qty 20000

## 2013-02-18 MED ORDER — DOXAZOSIN MESYLATE 4 MG PO TABS
4.0000 mg | ORAL_TABLET | Freq: Every evening | ORAL | Status: DC
  Administered 2013-02-19: 4 mg via ORAL
  Filled 2013-02-18 (×2): qty 1

## 2013-02-18 MED ORDER — BACITRACIN ZINC 500 UNIT/GM EX OINT
TOPICAL_OINTMENT | CUTANEOUS | Status: DC | PRN
  Administered 2013-02-18: 1 via TOPICAL

## 2013-02-18 MED ORDER — DOCUSATE SODIUM 100 MG PO CAPS
100.0000 mg | ORAL_CAPSULE | Freq: Two times a day (BID) | ORAL | Status: DC
  Administered 2013-02-18 – 2013-02-20 (×4): 100 mg via ORAL
  Filled 2013-02-18 (×4): qty 1

## 2013-02-18 MED ORDER — PHENOL 1.4 % MT LIQD: 1.0000 | OROMUCOSAL | Status: DC | PRN

## 2013-02-18 MED ORDER — MORPHINE SULFATE 2 MG/ML IJ SOLN
1.0000 mg | INTRAMUSCULAR | Status: DC | PRN
  Administered 2013-02-18 (×2): 2 mg via INTRAVENOUS
  Filled 2013-02-18 (×2): qty 1

## 2013-02-18 MED ORDER — ALUM & MAG HYDROXIDE-SIMETH 200-200-20 MG/5ML PO SUSP
30.0000 mL | Freq: Four times a day (QID) | ORAL | Status: DC | PRN
  Administered 2013-02-18: 30 mL via ORAL
  Filled 2013-02-18: qty 30

## 2013-02-18 MED ORDER — DIAZEPAM 5 MG PO TABS
ORAL_TABLET | ORAL | Status: AC
  Administered 2013-02-18: 5 mg
  Filled 2013-02-18: qty 1

## 2013-02-18 MED ORDER — GLYCOPYRROLATE 0.2 MG/ML IJ SOLN
INTRAMUSCULAR | Status: DC | PRN
  Administered 2013-02-18: 0.4 mg via INTRAVENOUS
  Administered 2013-02-18: 0.1 mg via INTRAVENOUS

## 2013-02-18 MED ORDER — PROPOFOL 10 MG/ML IV BOLUS
INTRAVENOUS | Status: DC | PRN
Start: 2013-02-18 — End: 2013-02-18
  Administered 2013-02-18: 50 mg via INTRAVENOUS
  Administered 2013-02-18: 200 mg via INTRAVENOUS

## 2013-02-18 MED ORDER — BUPIVACAINE-EPINEPHRINE 0.25% -1:200000 IJ SOLN
INTRAMUSCULAR | Status: DC | PRN
  Administered 2013-02-18: 27 mL

## 2013-02-18 MED ORDER — LIDOCAINE HCL (CARDIAC) 20 MG/ML IV SOLN
INTRAVENOUS | Status: DC | PRN
  Administered 2013-02-18: 100 mg via INTRAVENOUS

## 2013-02-18 MED ORDER — ARTIFICIAL TEARS OP OINT
TOPICAL_OINTMENT | OPHTHALMIC | Status: DC | PRN
  Administered 2013-02-18: 1 via OPHTHALMIC

## 2013-02-18 MED ORDER — ZOLPIDEM TARTRATE 5 MG PO TABS: 5.0000 mg | ORAL_TABLET | Freq: Every evening | ORAL | Status: DC | PRN

## 2013-02-18 MED ORDER — HYDROMORPHONE HCL PF 1 MG/ML IJ SOLN
0.2500 mg | INTRAMUSCULAR | Status: DC | PRN
  Administered 2013-02-18 (×4): 0.5 mg via INTRAVENOUS

## 2013-02-18 MED ORDER — HYDROCHLOROTHIAZIDE 12.5 MG PO CAPS
12.5000 mg | ORAL_CAPSULE | Freq: Every day | ORAL | Status: DC
  Administered 2013-02-19 – 2013-02-20 (×2): 12.5 mg via ORAL
  Filled 2013-02-18 (×2): qty 1

## 2013-02-18 MED ORDER — LACTATED RINGERS IV SOLN
INTRAVENOUS | Status: DC | PRN
  Administered 2013-02-18 (×2): via INTRAVENOUS

## 2013-02-18 MED ORDER — LOPERAMIDE HCL 2 MG PO CAPS
4.0000 mg | ORAL_CAPSULE | Freq: Four times a day (QID) | ORAL | Status: DC | PRN
Start: 2013-02-18 — End: 2013-02-20
  Filled 2013-02-18: qty 2

## 2013-02-18 SURGICAL SUPPLY — 84 items
APL SKNCLS STERI-STRIP NONHPOA (GAUZE/BANDAGES/DRESSINGS) ×1
BENZOIN TINCTURE PRP APPL 2/3 (GAUZE/BANDAGES/DRESSINGS) ×4 IMPLANT
BIT DRILL MOUNTAINEER FIX 14 (BIT) ×1
BIT DRILL MOUNTAINEER FIX 14MM (BIT) IMPLANT
BIT DRILL MOUNTAINER FXED 12MM (DRILL) IMPLANT
BLADE CLIPPER SURG NEURO (BLADE) ×2 IMPLANT
BUR NEURO DRILL SOFT 3.0X3.8M (BURR) ×5 IMPLANT
BUR PRESCISION 1.7 ELITE (BURR) ×3 IMPLANT
CLOSURE WOUND 1/2 X4 (GAUZE/BANDAGES/DRESSINGS) ×1
CLOTH BEACON ORANGE TIMEOUT ST (SAFETY) ×3 IMPLANT
CONT SPEC STER OR (MISCELLANEOUS) ×3 IMPLANT
CORDS BIPOLAR (ELECTRODE) ×3 IMPLANT
COVER SURGICAL LIGHT HANDLE (MISCELLANEOUS) ×3 IMPLANT
DRAIN CHANNEL 10F 3/8 F FF (DRAIN) IMPLANT
DRAIN CHANNEL 15F RND FF W/TCR (WOUND CARE) IMPLANT
DRAIN HEMOVAC 1/8 X 5 (WOUND CARE) IMPLANT
DRAPE C-ARM 42X72 X-RAY (DRAPES) ×3 IMPLANT
DRAPE INCISE IOBAN 66X45 STRL (DRAPES) ×3 IMPLANT
DRAPE PED LAPAROTOMY (DRAPES) ×3 IMPLANT
DRAPE POUCH INSTRU U-SHP 10X18 (DRAPES) ×3 IMPLANT
DRAPE PROXIMA HALF (DRAPES) ×11 IMPLANT
DRAPE SURG 17X23 STRL (DRAPES) ×16 IMPLANT
DRAPE TABLE COVER HEAVY DUTY (DRAPES) ×3 IMPLANT
DRILL BIT MOUNTAINEER FIX 14MM (BIT) ×3
DRILL MOUNTAINEER FIXED 12MM (DRILL) ×3
DRSG MEPILEX BORDER 4X8 (GAUZE/BANDAGES/DRESSINGS) ×1 IMPLANT
DURAPREP 26ML APPLICATOR (WOUND CARE) ×3 IMPLANT
ELECT BLADE 4.0 EZ CLEAN MEGAD (MISCELLANEOUS) ×3
ELECT CAUTERY BLADE 6.4 (BLADE) ×3 IMPLANT
ELECT REM PT RETURN 9FT ADLT (ELECTROSURGICAL) ×3
ELECTRODE BLDE 4.0 EZ CLN MEGD (MISCELLANEOUS) ×1 IMPLANT
ELECTRODE REM PT RTRN 9FT ADLT (ELECTROSURGICAL) ×1 IMPLANT
EVACUATOR SILICONE 100CC (DRAIN) ×1 IMPLANT
GAUZE SPONGE 4X4 16PLY XRAY LF (GAUZE/BANDAGES/DRESSINGS) ×8 IMPLANT
GLOVE BIO SURGEON STRL SZ7 (GLOVE) ×3 IMPLANT
GLOVE BIO SURGEON STRL SZ8 (GLOVE) ×5 IMPLANT
GLOVE BIOGEL PI IND STRL 8 (GLOVE) ×1 IMPLANT
GLOVE BIOGEL PI INDICATOR 8 (GLOVE) ×2
GOWN STRL NON-REIN LRG LVL3 (GOWN DISPOSABLE) ×6 IMPLANT
GOWN STRL REIN XL XLG (GOWN DISPOSABLE) ×5 IMPLANT
IV CATH 14GX2 1/4 (CATHETERS) ×3 IMPLANT
KIT BASIN OR (CUSTOM PROCEDURE TRAY) ×3 IMPLANT
KIT ROOM TURNOVER OR (KITS) ×3 IMPLANT
MIX DBX 10CC 35% BONE (Bone Implant) ×2 IMPLANT
NDL HYPO 25GX1X1/2 BEV (NEEDLE) ×1 IMPLANT
NEEDLE 22X1 1/2 (OR ONLY) (NEEDLE) ×2 IMPLANT
NEEDLE 27GAX1X1/2 (NEEDLE) ×3 IMPLANT
NEEDLE HYPO 25GX1X1/2 BEV (NEEDLE) ×3 IMPLANT
NS IRRIG 1000ML POUR BTL (IV SOLUTION) ×3 IMPLANT
PACK LAMINECTOMY ORTHO (CUSTOM PROCEDURE TRAY) ×3 IMPLANT
PAD ARMBOARD 7.5X6 YLW CONV (MISCELLANEOUS) ×6 IMPLANT
PATTIES SURGICAL .5 X.5 (GAUZE/BANDAGES/DRESSINGS) ×3 IMPLANT
PATTIES SURGICAL .5 X1 (DISPOSABLE) ×2 IMPLANT
PATTIES SURGICAL .5 X3 (DISPOSABLE) IMPLANT
PATTIES SURGICAL .5X1.5 (GAUZE/BANDAGES/DRESSINGS) ×1 IMPLANT
PIN MAYFIELD SKULL DISP (PIN) ×3 IMPLANT
ROD MOUNTAINEER 120MM (Rod) ×4 IMPLANT
SCREW ANGLE MT FAVORED 4.0X24M (Screw) ×4 IMPLANT
SCREW F A 3.5X12 (Screw) ×6 IMPLANT
SCREW F A 3.5X14 (Screw) ×6 IMPLANT
SCREW INNER (Screw) ×20 IMPLANT
SCREW MOUNTAINEER 4.0X26MM (Screw) ×4 IMPLANT
SPONGE GAUZE 4X4 12PLY (GAUZE/BANDAGES/DRESSINGS) ×3 IMPLANT
SPONGE GAUZE 4X4 12PLY STER LF (GAUZE/BANDAGES/DRESSINGS) ×2 IMPLANT
SPONGE INTESTINAL PEANUT (DISPOSABLE) ×2 IMPLANT
SPONGE SURGIFOAM ABS GEL 100 (HEMOSTASIS) ×3 IMPLANT
STRIP CLOSURE SKIN 1/2X4 (GAUZE/BANDAGES/DRESSINGS) ×1 IMPLANT
SURGIFLO TRUKIT (HEMOSTASIS) IMPLANT
SUT BONE WAX W31G (SUTURE) ×2 IMPLANT
SUT MNCRL AB 4-0 PS2 18 (SUTURE) ×3 IMPLANT
SUT VIC AB 0 CT1 18XCR BRD 8 (SUTURE) ×1 IMPLANT
SUT VIC AB 0 CT1 8-18 (SUTURE) ×3
SUT VIC AB 1 CT1 18XCR BRD 8 (SUTURE) ×2 IMPLANT
SUT VIC AB 1 CT1 8-18 (SUTURE) ×6
SUT VIC AB 2-0 CT2 18 VCP726D (SUTURE) ×5 IMPLANT
SYR BULB IRRIGATION 50ML (SYRINGE) ×3 IMPLANT
SYR CONTROL 10ML LL (SYRINGE) ×5 IMPLANT
TAPE CLOTH 4X10 WHT NS (GAUZE/BANDAGES/DRESSINGS) ×3 IMPLANT
TAPE CLOTH SURG 4X10 WHT LF (GAUZE/BANDAGES/DRESSINGS) ×2 IMPLANT
TOWEL OR 17X24 6PK STRL BLUE (TOWEL DISPOSABLE) ×3 IMPLANT
TOWEL OR 17X26 10 PK STRL BLUE (TOWEL DISPOSABLE) ×3 IMPLANT
TRAY FOLEY CATH 16FRSI W/METER (SET/KITS/TRAYS/PACK) ×3 IMPLANT
WATER STERILE IRR 1000ML POUR (IV SOLUTION) ×3 IMPLANT
YANKAUER SUCT BULB TIP NO VENT (SUCTIONS) ×3 IMPLANT

## 2013-02-18 NOTE — Progress Notes (Signed)
Pt arrived to pacu sedated.  Arousable, purposeful mnt and follows simple commands.  Interpreter to be called when he is more awake.

## 2013-02-18 NOTE — Progress Notes (Signed)
Interpreter at bedside.

## 2013-02-18 NOTE — Transfer of Care (Signed)
Immediate Anesthesia Transfer of Care Note  Patient: Travis Arnold  Procedure(s) Performed: Procedure(s) with comments: POSTERIOR CERVICAL FUSION/C3 - C4, C4 - C5, C5 - C6, C6 - C7, C7 - T1 (N/A) - Posterior cervical fusion, cervical 3-4, cervical 4-5, cervical 5-6, cervical 6-7, cervical 7-thoracic 1 with instrumentation, allograft.  Patient Location: PACU  Anesthesia Type:General  Level of Consciousness: sedated and patient cooperative  Airway & Oxygen Therapy: Patient Spontanous Breathing and Patient connected to nasal cannula oxygen  Post-op Assessment: Report given to PACU RN and Post -op Vital signs reviewed and stable  Post vital signs: Reviewed and stable  Complications: No apparent anesthesia complications

## 2013-02-18 NOTE — Anesthesia Postprocedure Evaluation (Signed)
  Anesthesia Post-op Note  Patient: Travis Arnold  Procedure(s) Performed: Procedure(s) with comments: POSTERIOR CERVICAL FUSION/C3 - C4, C4 - C5, C5 - C6, C6 - C7, C7 - T1 (N/A) - Posterior cervical fusion, cervical 3-4, cervical 4-5, cervical 5-6, cervical 6-7, cervical 7-thoracic 1 with instrumentation, allograft.  Patient Location: PACU  Anesthesia Type:General  Level of Consciousness: awake, alert  and oriented  Airway and Oxygen Therapy: Patient Spontanous Breathing  Post-op Pain: mild  Post-op Assessment: Post-op Vital signs reviewed, Patient's Cardiovascular Status Stable, Respiratory Function Stable and No signs of Nausea or vomiting  Post-op Vital Signs: Reviewed and stable  Complications: No apparent anesthesia complications

## 2013-02-18 NOTE — H&P (Signed)
PREOPERATIVE H&P  Chief Complaint: neck pain  HPI: Travis Arnold is a 63 y.o. male who is s/p multilevel cervical fusions. Patient sent on to have postop neck pain. Postop CT scan revealed nonunion at C3/4 and severe facet arthritis with instability at C7/T1. Decision was made to proceed with a C3-T1 PSF.  Past Medical History  Diagnosis Date  . Hypertension   . Bronchitis     hx  . Sleep apnea     does not use cpap  . GERD (gastroesophageal reflux disease)   . Cancer     skin melanoma back  . Arthritis   . History of stress test 2000    many yrs. ago due to family history of several cardiac deaths in family prior to age 72   . Sleep disturbance     given CPAP machine, but needs new study, doesn't use CPAP currently   . ENIDPOEU(235.3)    Past Surgical History  Procedure Laterality Date  . Cataract extraction Left 11  . Cervical disc surgery  98.11.    x2  . Shoulder arthroscopy Bilateral 09    spurs  . Shoulder arthroscopy Right 10    spurs  . Hernia repair Bilateral [id number]    several both sides  . Tonsillectomy    . Eye surgery Left     detached retina   History   Social History  . Marital Status: Married    Spouse Name: N/A    Number of Children: N/A  . Years of Education: N/A   Social History Main Topics  . Smoking status: Never Smoker   . Smokeless tobacco: None     Comment: 3-4 shots a nite  . Alcohol Use: 12.6 oz/week    21 Shots of liquor per week     Comment: 2 drinks er night   . Drug Use: No  . Sexual Activity: None   Other Topics Concern  . None   Social History Narrative  . None   History reviewed. No pertinent family history. No Known Allergies Prior to Admission medications   Medication Sig Start Date End Date Taking? Authorizing Provider  acetaminophen (TYLENOL) 500 MG tablet Take 1,000 mg by mouth daily as needed for moderate pain.   Yes Historical Provider, MD  aspirin EC 81 MG tablet Take 81 mg by mouth every morning.   Yes  Historical Provider, MD  atorvastatin (LIPITOR) 20 MG tablet Take 20 mg by mouth every evening.   Yes Historical Provider, MD  doxazosin (CARDURA) 4 MG tablet Take 4 mg by mouth every evening.   Yes Historical Provider, MD  esomeprazole (NEXIUM) 40 MG capsule Take 40 mg by mouth See admin instructions. Takes 1 tablet every morning, but can take another tablet during the day if needed for acid reflux.   Yes Historical Provider, MD  finasteride (PROSCAR) 5 MG tablet Take 5 mg by mouth every morning.   Yes Historical Provider, MD  lisinopril-hydrochlorothiazide (PRINZIDE,ZESTORETIC) 10-12.5 MG per tablet Take 1 tablet by mouth every morning.    Yes Historical Provider, MD  loperamide (IMODIUM A-D) 2 MG tablet Take 4 mg by mouth 4 (four) times daily as needed for diarrhea or loose stools.   Yes Historical Provider, MD  meclizine (ANTIVERT) 50 MG tablet Take 25 mg by mouth 3 (three) times daily as needed for dizziness.    Yes Historical Provider, MD  meloxicam (MOBIC) 7.5 MG tablet Take 7.5 mg by mouth every evening.   Yes Historical Provider,  MD  metoprolol (LOPRESSOR) 50 MG tablet Take 50 mg by mouth every morning.   Yes Historical Provider, MD  traMADol (ULTRAM) 50 MG tablet Take 50 mg by mouth 2 (two) times daily as needed for moderate pain.    Yes Historical Provider, MD     All other systems have been reviewed and were otherwise negative with the exception of those mentioned in the HPI and as above.  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

## 2013-02-18 NOTE — Anesthesia Preprocedure Evaluation (Addendum)
Anesthesia Evaluation  Patient identified by MRN, date of birth, ID band Patient awake    Reviewed: Allergy & Precautions, H&P , NPO status , Patient's Chart, lab work & pertinent test results  Airway Mallampati: II  Neck ROM: Limited    Dental  (+) Teeth Intact and Dental Advisory Given   Pulmonary sleep apnea ,  02-06-14 Chest x-ray FINDINGS: The cardiac silhouette remains mildly enlarged. The patient has taken a greater inspiration than on the prior study. There remains mild vascular crowding at the lung bases, however there is improved aeration at the left base with interval resolution of previously described opacity. There is no evidence of new airspace consolidation, edema, pleural effusion, or pneumothorax. No acute osseous abnormality is identified.   breath sounds clear to auscultation  Pulmonary exam normal       Cardiovascular Exercise Tolerance: Good hypertension, Pt. on medications and Pt. on home beta blockers Rhythm:Regular Rate:Normal  10-Sep-2012 14:36:22 [facility] ROUTINE RECORD Sinus bradycardia with Premature atrial complexes RSR' or QR pattern in V1 suggests right ventricular conduction delay Borderline ECG First degree A-V block RESOLVED SINCE PREVIOUS   Neuro/Psych  Headaches,    GI/Hepatic Neg liver ROS, GERD-  Medicated and Controlled,  Endo/Other  negative endocrine ROS  Renal/GU negative Renal ROS     Musculoskeletal  (+) Arthritis -, Osteoarthritis,    Abdominal   Peds  Hematology negative hematology ROS (+)   Anesthesia Other Findings Pt is Hearing impaired/ Interpretor @ bedside  Hairline fracture upper R central inciser  Reproductive/Obstetrics                      Anesthesia Physical Anesthesia Plan  ASA: II  Anesthesia Plan: General   Post-op Pain Management:    Induction: Intravenous  Airway Management Planned: Oral  ETT  Additional Equipment:   Intra-op Plan:   Post-operative Plan: Extubation in OR  Informed Consent: I have reviewed the patients History and Physical, chart, labs and discussed the procedure including the risks, benefits and alternatives for the proposed anesthesia with the patient or authorized representative who has indicated his/her understanding and acceptance.   Dental advisory given  Plan Discussed with: CRNA, Surgeon and Anesthesiologist  Anesthesia Plan Comments:        Anesthesia Quick Evaluation

## 2013-02-18 NOTE — Addendum Note (Signed)
Addendum created 02/18/13 1627 by Wanita Chamberlain, CRNA   Modules edited: Anesthesia Medication Administration

## 2013-02-18 NOTE — Preoperative (Signed)
Beta Blockers   Lopressor 50 mgs taken on 02-18-13 @

## 2013-02-18 NOTE — Plan of Care (Signed)
Problem: Consults Goal: Diagnosis - Spinal Surgery Cervical Spine Fusion     

## 2013-02-19 NOTE — Evaluation (Signed)
Occupational Therapy Evaluation and Discharge Patient Details Name: Travis Arnold MRN: [id number] DOB: 11/11/1950 Today's Date: 02/19/2013 Time: 5361-4431 OT Time Calculation (min): 52 min  OT Assessment / Plan / Recommendation History of present illness Posterior spinal fusion, C3-4, C4-5, C5-6, C6-7, C7-T1. Placement of posterior segmental instrumentation C3-T1. Bilateral laminotomies with partial facetectomy and foraminotomy, C6-7. Use of morselized allograft. Cranial tong application and removal   Clinical Impression   This 63 yo male admitted and underwent above presents to acute OT with all education completed with pt and wife via an interpreter for the deaf. No further OT needs, we will sign off.    OT Assessment  Patient does not need any further OT services    Follow Up Recommendations  No OT follow up       Equipment Recommendations  None recommended by OT          Precautions / Restrictions Precautions Precautions: Cervical Required Braces or Orthoses: Cervical Brace Cervical Brace: Hard collar Restrictions Weight Bearing Restrictions: No   Pertinent Vitals/Pain 8/10 neck; repositioned    ADL  Transfers/Ambulation Related to ADLs: S for all  ADL Comments: Wife will A with BADLs prn. Pt and wife made aware that if using a crew neck t-shirt it would be better to take collar off first then apply shirt, then reapply collar with button up shirt on over tshirt. Pt will cross legs for LBD (he demonstrated that he could do this); pt normally takes tub baths, I advised him against this due to the strain on his neck of getting in and out of the tub and recommended he have a seat to sit on if showering. Educated pt and wife on doffing/donning c-collar and changing out of pads (wife demonstrated re-applying collar). Post op cervical surgeyr handout provided.     Acute Rehab OT Goals Patient Stated Goal: Home tomorrow  Visit Information  Last OT Received On:  02/19/13 Assistance Needed: +1 PT/OT/SLP Co-Evaluation/Treatment: Yes (partial) Reason for Co-Treatment: Other (comment) (partial due to time with pt likely to d/c soon and interpreter avail) PT goals addressed during session: Mobility/safety with mobility;Balance OT goals addressed during session: ADL's and self-care History of Present Illness: Posterior spinal fusion, C3-4, C4-5, C5-6, C6-7, C7-T1. Placement of posterior segmental instrumentation C3-T1. Bilateral laminotomies with partial facetectomy and foraminotomy, C6-7. Use of morselized allograft. Cranial tong application and removal       Prior [place] expects to be discharged to:: Private residence Living Arrangements: Spouse/significant other;Children Available Help at Discharge: Family;Available 24 hours/day Type of Home: House Home Access: Stairs to enter [employer] of Steps: 1 Entrance Stairs-Rails: None Home Layout: One level Home Equipment: None Prior Function Level of Independence: Independent Communication Communication: Deaf (wife also deaf, interpreter present during eval) Dominant Hand: Right         Vision/Perception Vision - History Patient Visual Report: No change from baseline   Cognition  Cognition Arousal/Alertness: Awake/alert Behavior During Therapy: WFL for tasks assessed/performed Overall Cognitive Status: Within Functional Limits for tasks assessed    Extremity/Trunk Assessment Upper Extremity Assessment Upper Extremity Assessment: Overall WFL for tasks assessed Lower Extremity Assessment Lower Extremity Assessment: Overall WFL for tasks assessed     Mobility Bed Mobility Overal bed mobility: Needs Assistance Bed Mobility: Sidelying to Sit;Rolling;Sit to Sidelying Rolling: Supervision Sidelying to sit: Min guard Sit to sidelying: Min guard General bed mobility comments: assist for head support to supine and coming up to sit for  balance Transfers Overall transfer level: Needs assistance Equipment used: None Transfers: Sit to/from Stand Sit to Stand: Min guard;Supervision General transfer comment: cues initially to push from chair with pt attempting to pull on walker, educated best if he can use LE's only. practiced without UE assist with minguard for safety and increased time required; also educated with wife in car transfers and demonstrated technique, discussed transfers at home to avoid low surfaces and sit near armrest if sitting on couch        Balance Balance Overall balance assessment: Needs assistance Standing balance support: No upper extremity supported Standing balance-Leahy Scale: Good Standing balance comment: stands safely unsupported, needs supervision initially for safety   End of Session OT - End of Session Equipment Utilized During Treatment: Gait belt;Cervical collar Activity Tolerance: Patient tolerated treatment well Patient left: in chair;with call bell/phone within reach;with family/visitor present       Almon Register [phone] 02/19/2013, 12:18 PM

## 2013-02-19 NOTE — Plan of Care (Signed)
Problem: Phase III Progression Outcomes Goal: Pain controlled on oral analgesia Outcome: Progressing     

## 2013-02-19 NOTE — Progress Notes (Signed)
Patient looks well. C/o neck spasms, as expected.   BP 148/99  Pulse 81  Temp(Src) 98.8 F (37.1 C) (Oral)  Resp 16  Ht 6\' 2"  (1.88 m)  Wt 191 lb (86.637 kg)  BMI 24.51 kg/m2  SpO2 96%  NVI Collar fitting approprately Appears very comfortable  POD #1 after C3-T1 posterior fusion  - up with PT and OT today - continue pain meds - ambulate as tolerated - philly collar to bedside for showering - likely d/c home tomrrow

## 2013-02-19 NOTE — Progress Notes (Signed)
Orthopedic Tech Progress Note Patient Details:  Travis Arnold 1950/06/19 [id number] Collar delivered to patient in room Ortho Devices Type of Ortho Device: [place] cervical collar Ortho Device/Splint Interventions: Ordered   Asia R Thompson 02/19/2013, 10:07 AM

## 2013-02-19 NOTE — Op Note (Signed)
NAMEJONCARLOS, Arnold NO.:  [id number]  MEDICAL RECORD NO.:  [id number]  LOCATION:  [id number]                        FACILITY:  [facility]  PHYSICIAN:  Phylliss Bob, MD      DATE OF BIRTH:  1950-09-19  DATE OF PROCEDURE:  02/18/2013                              OPERATIVE REPORT   POSTOPERATIVE DIAGNOSES: 1. 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 2. Severe facet arthritis bilaterally at C6-7 and C7-T1. 3. Moderate neural foraminal stenosis bilaterally at C6-7.  POSTOPERATIVE DIAGNOSES: 1. 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 2. Severe facet arthritis bilaterally at C6-7 and C7-T1. 3. Moderate neural foraminal stenosis bilaterally at C6-7.  PROCEDURE: 1. Posterior spinal fusion, C3-4, C4-5, C5-6, C6-7, C7-T1. 2. Placement of posterior segmental instrumentation C3-T1. 3. Bilateral laminotomies with partial facetectomy and foraminotomy,     C6-7. 4. Use of morselized allograft. 5. Cranial tong application and removal.  SURGEON:  Phylliss Bob, MD  ASSISTANT:  Pricilla Holm, [facility]  ANESTHESIA:  General endotracheal anesthesia.  COMPLICATIONS:  None.  DISPOSITION:  Stable.  ESTIMATED BLOOD LOSS:  150 mL.  INSTRUMENTATION USED:  DePuy instrumentation system.  INDICATIONS FOR PROCEDURE:  Briefly, Travis Arnold is a very pleasant 63- year-old male, who is status post an attempted C3-4 fusion.  The patient did go on to have a postoperative neck pain.  A postoperative CAT scan did reveal a nonunion at the C3-4 level.  Also of note, the patient did have prominent and rather significant bilateral facet arthritis at C6-7 and at C7-T1.  He also was noted to have foraminal stenosis at the C6-7 level.  Again, we did go forward with conservative care, but he did continue to have ongoing and rather substantial paint.  We therefore did discuss proceeding with an instrumented posterior spinal fusion from C3- T1 with a C6-7 laminotomy as reflected above.  The patient did fully understand the risks and limitations of the  procedure as outlined in my preoperative note.  OPERATIVE DETAILS:  On February 18, 2013, the patient was brought to surgery and general endotracheal anesthesia was administered.  A cranial tong head holder was placed by me.  The patient was then rolled into the prone position and the head was placed in the appropriate degree of extension.  The patient's arms were secured to the sides.  All bony prominences were meticulously padded.  The patient's shoulders were taped to the inferior aspect of the bed.  The neck was then prepped and draped in the usual sterile fashion.  Antibiotics were given and a time- out procedure was performed.  I then made a midline incision from approximately C3 to approximately T1.  The fascia was sharply incised in the midline and the paraspinal musculature was bluntly swept laterally. The lamina from C3-T1 was subperiosteally exposed.  A lateral intraoperative radiograph did confirm the appropriate operative level. I then used a 1.7-mm bur to decorticate the facet joints from C3-T1 bilaterally.  I then performed a laminotomy and partial facetectomy and foraminotomy bilaterally at C6-7.  The C7 nerves were free of compression at the termination of this portion of the procedure.  I then was able to use a nerve hook to palpate the superomedial aspect of the C7 pedicle.  I then used a 1.7-mm bur followed by a gearshift probe followed by a 3.5-mm tap in order to cannulate the C7 pedicles bilaterally, using anatomic landmarks.  A ball-tip probe was used to confirm that there was no cortical violation of the cannulated pedicles. Bone wax was placed in the cannulated pedicles.  Using anatomical landmarks, I did use a 3-mm bur followed by a gearshift probe followed by a 3.5-mm tap to cannulate the T1 pedicles bilaterally.  A ball-tip probe was used to confirm that there was no cortical violation.  I then prepared entry points for lateral mass screws from C3 and C5.   The trajectory of the lateral mass screws were approximately 30 degrees cephalad and 30 degrees lateral.  I then used a 14 mm drill, 2.5 mm in diameter, followed by a 3-mm tap.  A ball-tip probe was used to confirm there was no cortical violation.  At this point, I did use a 3-mm bur to decorticate the posterior elements from C3-T1 including the T1 transverse process.  The allograft in the form of DBX mix was packed along the posterior elements and into the facet joints to be fused.  I then placed 3.5 x 12 mm screws at C3 and C4 bilaterally, 3.5 x 14 mm screws placed into C5 bilaterally, 4 x 24 mm screws were placed at C7, and 4 x 26 mm screws were placed at T1.  I then selected posterior cervical rods which were cut and fashioned into the appropriate degree of lordosis.  The rods were secured to the heads of the screws and caps were placed followed by a final locking procedure.  I did ensure the neck was in the appropriate degree of extension when placing the screws when locking down the rod.  Of note, I did copiously irrigate the wound throughout the procedure, and approximately 3 L was used all together. A lateral intraoperative radiograph did confirm appropriate positioning of the hardware and did confirm that the appropriate levels were fused. I then explored the wound for any undue bleeding and none was encountered.  I then closed the fascia using #1 Vicryl.  The subcutaneous layer was closed using 2-0 Vicryl.  The skin was closed using 3-0 Monocryl.  Benzoin and Steri-Strips were applied followed by sterile dressing.  All instrument counts were correct at the termination of the procedure.  The patient was then rolled supine and the [place] head holder was removed.  Of note, Pricilla Holm was my assistant throughout the entirety of the procedure, and did  aid in essential retraction and suctioning needed throughout the surgery.     Phylliss Bob, MD     MD/MEDQ  D:   02/18/2013  T:  02/19/2013  Job:  [id number]  cc:   Delanna Ahmadi, M.D.

## 2013-02-19 NOTE — Progress Notes (Signed)
Utilization review completed.  

## 2013-02-19 NOTE — Evaluation (Signed)
Physical Therapy Evaluation Patient Details Name: Travis Arnold MRN: [id number] DOB: 1950-02-26 Today's Date: 02/19/2013 Time: 4098-1191 PT Time Calculation (min): 28 min  PT Assessment / Plan / Recommendation History of Present Illness  Posterior spinal fusion, C3-4, C4-5, C5-6, C6-7, C7-T1. Placement of posterior segmental instrumentation C3-T1. Bilateral laminotomies with partial facetectomy and foraminotomy, C6-7. Use of morselized allograft. Cranial tong application and removal  Clinical Impression  Patient and wife instructed in mobility with spinal precautions and practiced as below.  Feel education adequate for safe mobility in the home with wife (and son) assist prn.  No follow up PT needs identified at this time.    PT Assessment  Patent does not need any further PT services    Follow Up Recommendations  No PT follow up    Does the patient have the potential to tolerate intense rehabilitation      Barriers to Discharge        Equipment Recommendations  None recommended by PT    Recommendations for Other Services     Frequency      Precautions / Restrictions Precautions Precautions: Cervical Required Braces or Orthoses: Cervical Brace Cervical Brace: Hard collar   Pertinent Vitals/Pain 6/10 in neck and low back, RN aware      Mobility  Bed Mobility Overal bed mobility: Needs Assistance Bed Mobility: Sidelying to Sit;Rolling;Sit to Sidelying Rolling: Supervision Sidelying to sit: Min guard Sit to sidelying: Min guard General bed mobility comments: assist for head support to supine and coming up to sit for balance Transfers Overall transfer level: Needs assistance Equipment used: None Transfers: Sit to/from Stand Sit to Stand: Min guard;Supervision General transfer comment: cues initially to push from chair with pt attempting to pull on walker, educated best if he can use LE's only. practiced without UE assist with minguard for safety and increased time  required; also educated with wife in car transfers and demonstrated technique, discussed transfers at home to avoid low surfaces and sit near armrest if sitting on couch Ambulation/Gait Ambulation/Gait assistance: Supervision Ambulation Distance (Feet): 150 Feet Assistive device: None Gait Pattern/deviations: Step-through pattern;WFL(Within Functional Limits) Gait velocity: WNL Gait velocity interpretation: at or above normal speed for age/gender General Gait Details: cues to slow down due to IV, O2        PT Goals(Current goals can be found in the care plan section) Acute Rehab PT Goals PT Goal Formulation: No goals set, d/c therapy  Visit Information  Last PT Received On: 02/19/13 Assistance Needed: +1 PT/OT/SLP Co-Evaluation/Treatment: Yes Reason for Co-Treatment: Other (comment) (partial due to time with pt likely to d/c soon and interpreter avail) PT goals addressed during session: Mobility/safety with mobility;Balance History of Present Illness: Posterior spinal fusion, C3-4, C4-5, C5-6, C6-7, C7-T1. Placement of posterior segmental instrumentation C3-T1. Bilateral laminotomies with partial facetectomy and foraminotomy, C6-7. Use of morselized allograft. Cranial tong application and removal       Prior [place] expects to be discharged to:: Private residence Living Arrangements: Spouse/significant other;Children Available Help at Discharge: Family;Available 24 hours/day Type of Home: House Home Access: Stairs to enter [employer] of Steps: 1 Entrance Stairs-Rails: None Home Layout: One level Home Equipment: None Prior Function Level of Independence: Independent Communication Communication: Deaf (wife also deaf, interpreter present during eval) Dominant Hand: Right    Cognition  Cognition Arousal/Alertness: Awake/alert Behavior During Therapy: WFL for tasks assessed/performed Overall Cognitive Status: Within Functional  Limits for tasks assessed    Extremity/Trunk Assessment Lower Extremity Assessment  Lower Extremity Assessment: Overall WFL for tasks assessed   Balance Balance Overall balance assessment: Needs assistance Standing balance support: No upper extremity supported Standing balance-Leahy Scale: Good Standing balance comment: stands safely unsupported, needs supervision initially for safety  End of Session PT - End of Session Equipment Utilized During Treatment: Gait belt;Oxygen Activity Tolerance: Patient tolerated treatment well Patient left: in chair;with family/visitor present;with call bell/phone within reach  GP Functional Assessment Tool Used: Clinical Observation Functional Limitation: Mobility: Walking and moving around Mobility: Walking and Moving Around Current Status (V6122): At least 1 percent but less than 20 percent impaired, limited or restricted Mobility: Walking and Moving Around Goal Status [phone]): At least 1 percent but less than 20 percent impaired, limited or restricted Mobility: Walking and Moving Around Discharge Status [phone]): At least 1 percent but less than 20 percent impaired, limited or restricted   [facility] 02/19/2013, 10:28 AM Magda Kiel, [place] 02/19/2013

## 2013-02-20 MED FILL — Heparin Sodium (Porcine) Inj 1000 Unit/ML: INTRAMUSCULAR | Qty: 30 | Status: AC

## 2013-02-20 MED FILL — Sodium Chloride IV Soln 0.9%: INTRAVENOUS | Qty: 3000 | Status: AC

## 2013-02-20 NOTE — Progress Notes (Signed)
Translator used to give d/c instructions. Pt verbalized understanding d/c instructions. Rx given. Education on cervical restrictions given. IVs removed. Pt d/c via w/c in a stable condition.

## 2013-02-20 NOTE — Progress Notes (Signed)
Patient looks great. Moderate neck pain and spasms, as expected.  BP 137/81  Pulse 74  Temp(Src) 98 F (36.7 C) (Oral)  Resp 16  Ht 6\' 2"  (1.88 m)  Wt 191 lb (86.637 kg)  BMI 24.51 kg/m2  SpO2 93%  NVI Collar in place and well applied  POD #2 after C3-T1 PSF  - ok to d/c home today - patient needs philly collar for showering - f/u 2 weeks

## 2013-02-24 ENCOUNTER — Encounter (HOSPITAL_COMMUNITY): Payer: Self-pay | Admitting: Orthopedic Surgery

## 2013-03-12 NOTE — Discharge Summary (Signed)
Patient ID: Travis Arnold MRN: [id number] DOB/AGE: [birth year] 63 y.o.  Admit date: 02/18/2013 Discharge date: 02/20/2013  Admission Diagnoses:  Active Problems:   Radiculopathy   Discharge Diagnoses:  Same  Past Medical History  Diagnosis Date  . Hypertension   . Bronchitis     hx  . Sleep apnea     does not use cpap  . GERD (gastroesophageal reflux disease)   . Cancer     skin melanoma back  . Arthritis   . History of stress test 2000    many yrs. ago due to family history of several cardiac deaths in family prior to age 51   . Sleep disturbance     given CPAP machine, but needs new study, doesn't use CPAP currently   . Headache(784.0)     Surgeries: Procedure(s): POSTERIOR CERVICAL FUSION/C3 - C4, C4 - C5, C5 - C6, C6 - C7, C7 - T1 on 02/18/2013   Discharged Condition: Improved  Hospital Course: Travis Arnold is an 63 y.o. male who was admitted 02/18/2013 for operative treatment of nonunion and radiculopathy. Patient has severe unremitting pain that affects sleep, daily activities, and work/hobbies. After pre-op clearance the patient was taken to the operating room on 02/18/2013 and underwent  Procedure(s): POSTERIOR CERVICAL FUSION/C3 - C4, C4 - C5, C5 - C6, C6 - C7, C7 - T1.    Patient was given perioperative antibiotics:  Anti-infectives   Start     Dose/Rate Route Frequency Ordered Stop   02/18/13 2000  ceFAZolin (ANCEF) IVPB 1 g/50 mL premix     1 g 100 mL/hr over 30 Minutes Intravenous Every 8 hours 02/18/13 1640 02/19/13 0612   02/18/13 1215  ceFAZolin (ANCEF) IVPB 2 g/50 mL premix  Status:  Discontinued     2 g 100 mL/hr over 30 Minutes Intravenous To Surgery 02/18/13 1201 02/18/13 1637   02/18/13 0600  ceFAZolin (ANCEF) IVPB 2 g/50 mL premix     2 g 100 mL/hr over 30 Minutes Intravenous On call to O.R. 02/17/13 1423 02/18/13 1247       Patient was given sequential compression devices, early ambulation to prevent DVT.  Patient benefited maximally from  hospital stay and there were no complications.    Recent vital signs: BP 137/81  Pulse 74  Temp(Src) 98 F (36.7 C) (Oral)  Resp 16  Ht 6\' 2"  (1.88 m)  Wt 86.637 kg (191 lb)  BMI 24.51 kg/m2  SpO2 93%  Discharge Medications:     Medication List    STOP taking these medications       acetaminophen 500 MG tablet  Commonly known as:  TYLENOL     aspirin EC 81 MG tablet     meloxicam 7.5 MG tablet  Commonly known as:  MOBIC     traMADol 50 MG tablet  Commonly known as:  ULTRAM      TAKE these medications       atorvastatin 20 MG tablet  Commonly known as:  LIPITOR  Take 20 mg by mouth every evening.     doxazosin 4 MG tablet  Commonly known as:  CARDURA  Take 4 mg by mouth every evening.     esomeprazole 40 MG capsule  Commonly known as:  NEXIUM  Take 40 mg by mouth See admin instructions. Takes 1 tablet every morning, but can take another tablet during the day if needed for acid reflux.     finasteride 5 MG tablet  Commonly known as:  PROSCAR  Take 5 mg by mouth every morning.     lisinopril-hydrochlorothiazide 10-12.5 MG per tablet  Commonly known as:  PRINZIDE,ZESTORETIC  Take 1 tablet by mouth every morning.     loperamide 2 MG tablet  Commonly known as:  IMODIUM A-D  Take 4 mg by mouth 4 (four) times daily as needed for diarrhea or loose stools.     meclizine 50 MG tablet  Commonly known as:  ANTIVERT  Take 25 mg by mouth 3 (three) times daily as needed for dizziness.     metoprolol 50 MG tablet  Commonly known as:  LOPRESSOR  Take 50 mg by mouth every morning.        Diagnostic Studies: Dg Cervical Spine 1 View  02/18/2013   CLINICAL DATA:  C3-T1 posterior fusion  EXAM: DG CERVICAL SPINE - 1 VIEW  COMPARISON:  DG CERVICAL SPINE 1 VIEW dated 02/18/2013  FINDINGS: Single portable lateral radiograph of the cervical spine is provided. Again noted is anterior cervical fusion from C3 through C5 without failure or complication. There is osseous fusion  across the C4-C5 and C5-C6 disc spaces.  There has been interval posterior spinal fusion from C3 through T1 with pedicle screws at C3, C4, C5, C7 and T1. The C7 and T1 screws are suboptimally visualized. There are postsurgical changes in the posterior paraspinal soft tissues.  There is an endotracheal tube present.  IMPRESSION: There has been interval posterior spinal fusion from C3 through T1 with pedicle screws at C3, C4, C5, C7 and T1. The C7 and T1 screws are suboptimally visualized. There are postsurgical changes in the posterior paraspinal soft tissues.   Electronically Signed   By: Kathreen Devoid   On: 02/18/2013 13:39   Dg Cervical Spine 1 View  02/18/2013   CLINICAL DATA:  Cervical fusion  EXAM: DG CERVICAL SPINE - 1 VIEW  COMPARISON:  None.  FINDINGS: Single portable cross-table lateral view of the cervical spine is provided. There is anterior cervical disc fusion from C3 through C5 without hardware failure or complication. There is osseous fusion across the C4-C5 and C5-C6 disc spaces. There is relative loss of the normal cervical lordosis.  There are 2 metallic probes posteriorly projecting between the C4-C5 spinous processes.  There is an endotracheal tube present.  IMPRESSION: Anterior cervical disc fusion from C3 through C5. There are 2 metallic probes posteriorly projecting between the C4-C5 spinous processes.   Electronically Signed   By: Kathreen Devoid   On: 02/18/2013 10:07    Disposition: 01-Home or Self Care      Discharge Orders   Future Orders Complete By Expires   Discharge patient  As directed      POD #2 after C3-T1 PSF  - ok to d/c home today  - patient needs philly collar for showering  - f/u 2 weeks  Signed: Justice Britain 03/12/2013, 3:06 PM

## 2014-02-17 ENCOUNTER — Encounter (HOSPITAL_BASED_OUTPATIENT_CLINIC_OR_DEPARTMENT_OTHER): Payer: Self-pay | Admitting: *Deleted

## 2014-02-18 ENCOUNTER — Encounter (HOSPITAL_BASED_OUTPATIENT_CLINIC_OR_DEPARTMENT_OTHER): Payer: Self-pay | Admitting: *Deleted

## 2014-02-18 NOTE — Progress Notes (Signed)
Sign language interpreter requested Has been out of town-will need istat and ekg Coming 2 hr preop

## 2014-02-22 ENCOUNTER — Ambulatory Visit (HOSPITAL_BASED_OUTPATIENT_CLINIC_OR_DEPARTMENT_OTHER): Payer: Medicare Other | Admitting: Anesthesiology

## 2014-02-22 ENCOUNTER — Encounter (HOSPITAL_BASED_OUTPATIENT_CLINIC_OR_DEPARTMENT_OTHER): Payer: Self-pay

## 2014-02-22 ENCOUNTER — Encounter (HOSPITAL_BASED_OUTPATIENT_CLINIC_OR_DEPARTMENT_OTHER)
Admission: RE | Disposition: A | Discharge status: Home / Self Care | Payer: Self-pay | Source: Ambulatory Visit | Attending: Orthopedic Surgery

## 2014-02-22 ENCOUNTER — Other Ambulatory Visit: Payer: Self-pay | Admitting: Orthopedic Surgery

## 2014-02-22 ENCOUNTER — Ambulatory Visit (HOSPITAL_BASED_OUTPATIENT_CLINIC_OR_DEPARTMENT_OTHER)
Admission: RE | Admit: 2014-02-22 | Discharge: 2014-02-22 | Disposition: A | Discharge status: Home / Self Care | Payer: Medicare Other | Source: Ambulatory Visit | Attending: Orthopedic Surgery | Admitting: Orthopedic Surgery

## 2014-02-22 DIAGNOSIS — I10 Essential (primary) hypertension: Secondary | ICD-10-CM | POA: Diagnosis not present

## 2014-02-22 DIAGNOSIS — Z79899 Other long term (current) drug therapy: Secondary | ICD-10-CM | POA: Insufficient documentation

## 2014-02-22 DIAGNOSIS — R001 Bradycardia, unspecified: Secondary | ICD-10-CM | POA: Diagnosis not present

## 2014-02-22 DIAGNOSIS — Z9889 Other specified postprocedural states: Secondary | ICD-10-CM | POA: Insufficient documentation

## 2014-02-22 DIAGNOSIS — M1990 Unspecified osteoarthritis, unspecified site: Secondary | ICD-10-CM | POA: Diagnosis not present

## 2014-02-22 DIAGNOSIS — Z7982 Long term (current) use of aspirin: Secondary | ICD-10-CM | POA: Insufficient documentation

## 2014-02-22 DIAGNOSIS — G4730 Sleep apnea, unspecified: Secondary | ICD-10-CM | POA: Diagnosis not present

## 2014-02-22 DIAGNOSIS — Z791 Long term (current) use of non-steroidal anti-inflammatories (NSAID): Secondary | ICD-10-CM | POA: Insufficient documentation

## 2014-02-22 DIAGNOSIS — K219 Gastro-esophageal reflux disease without esophagitis: Secondary | ICD-10-CM | POA: Diagnosis not present

## 2014-02-22 DIAGNOSIS — H9190 Unspecified hearing loss, unspecified ear: Secondary | ICD-10-CM | POA: Insufficient documentation

## 2014-02-22 DIAGNOSIS — Z85820 Personal history of malignant melanoma of skin: Secondary | ICD-10-CM | POA: Insufficient documentation

## 2014-02-22 DIAGNOSIS — M25512 Pain in left shoulder: Secondary | ICD-10-CM | POA: Diagnosis not present

## 2014-02-22 HISTORY — PX: SHOULDER ARTHROSCOPY WITH SUBACROMIAL DECOMPRESSION: SHX5684

## 2014-02-22 HISTORY — DX: Presence of external hearing-aid: Z97.4

## 2014-02-22 HISTORY — DX: Unspecified hearing loss, unspecified ear: H91.90

## 2014-02-22 LAB — POCT I-STAT, CHEM 8
BUN: 14 mg/dL (ref 6–23)
Calcium, Ion: 1.15 mmol/L (ref 1.13–1.30)
Chloride: 104 mEq/L (ref 96–112)
Creatinine, Ser: 0.8 mg/dL (ref 0.50–1.35)
Glucose, Bld: 109 mg/dL — ABNORMAL HIGH (ref 70–99)
HCT: 49 % (ref 39.0–52.0)
HEMOGLOBIN: 16.7 g/dL (ref 13.0–17.0)
POTASSIUM: 4.3 mmol/L (ref 3.5–5.1)
Sodium: 141 mmol/L (ref 135–145)
TCO2: 21 mmol/L (ref 0–100)

## 2014-02-22 SURGERY — SHOULDER ARTHROSCOPY WITH SUBACROMIAL DECOMPRESSION
Anesthesia: General | Site: Shoulder | Laterality: Left

## 2014-02-22 MED ORDER — ROPIVACAINE HCL 5 MG/ML IJ SOLN
INTRAMUSCULAR | Status: DC | PRN
  Administered 2014-02-22: 30 mL via PERINEURAL

## 2014-02-22 MED ORDER — DEXAMETHASONE SODIUM PHOSPHATE 4 MG/ML IJ SOLN
INTRAMUSCULAR | Status: DC | PRN
  Administered 2014-02-22: 10 mg via INTRAVENOUS

## 2014-02-22 MED ORDER — MIDAZOLAM HCL 2 MG/2ML IJ SOLN
INTRAMUSCULAR | Status: AC
  Filled 2014-02-22: qty 2

## 2014-02-22 MED ORDER — SUCCINYLCHOLINE CHLORIDE 20 MG/ML IJ SOLN
INTRAMUSCULAR | Status: DC | PRN
  Administered 2014-02-22: 100 mg via INTRAVENOUS

## 2014-02-22 MED ORDER — FENTANYL CITRATE 0.05 MG/ML IJ SOLN
50.0000 ug | INTRAMUSCULAR | Status: DC | PRN
  Administered 2014-02-22: 50 ug via INTRAVENOUS

## 2014-02-22 MED ORDER — LIDOCAINE HCL (CARDIAC) 20 MG/ML IV SOLN
INTRAVENOUS | Status: DC | PRN
  Administered 2014-02-22: 50 mg via INTRAVENOUS

## 2014-02-22 MED ORDER — MIDAZOLAM HCL 2 MG/2ML IJ SOLN
1.0000 mg | INTRAMUSCULAR | Status: DC | PRN
  Administered 2014-02-22: 2 mg via INTRAVENOUS

## 2014-02-22 MED ORDER — DOCUSATE SODIUM 100 MG PO CAPS: 100.0000 mg | ORAL_CAPSULE | Freq: Three times a day (TID) | ORAL | Status: DC | PRN

## 2014-02-22 MED ORDER — SODIUM CHLORIDE 0.9 % IR SOLN
Status: DC | PRN
  Administered 2014-02-22: 6000 mL

## 2014-02-22 MED ORDER — FENTANYL CITRATE 0.05 MG/ML IJ SOLN
INTRAMUSCULAR | Status: AC
  Filled 2014-02-22: qty 6

## 2014-02-22 MED ORDER — CHLORHEXIDINE GLUCONATE 4 % EX LIQD: 60.0000 mL | Freq: Once | CUTANEOUS | Status: DC

## 2014-02-22 MED ORDER — PROPOFOL 10 MG/ML IV BOLUS
INTRAVENOUS | Status: DC | PRN
  Administered 2014-02-22: 200 mg via INTRAVENOUS

## 2014-02-22 MED ORDER — OXYCODONE-ACETAMINOPHEN 5-325 MG PO TABS: 1.0000 | ORAL_TABLET | ORAL | Status: DC | PRN

## 2014-02-22 MED ORDER — CEFAZOLIN SODIUM-DEXTROSE 2-3 GM-% IV SOLR
INTRAVENOUS | Status: AC
  Filled 2014-02-22: qty 50

## 2014-02-22 MED ORDER — PROMETHAZINE HCL 25 MG/ML IJ SOLN: 6.2500 mg | INTRAMUSCULAR | Status: DC | PRN

## 2014-02-22 MED ORDER — EPHEDRINE SULFATE 50 MG/ML IJ SOLN
INTRAMUSCULAR | Status: DC | PRN
  Administered 2014-02-22 (×2): 10 mg via INTRAVENOUS

## 2014-02-22 MED ORDER — FENTANYL CITRATE 0.05 MG/ML IJ SOLN
INTRAMUSCULAR | Status: AC
  Filled 2014-02-22: qty 2

## 2014-02-22 MED ORDER — CEFAZOLIN SODIUM-DEXTROSE 2-3 GM-% IV SOLR
2.0000 g | INTRAVENOUS | Status: AC
  Administered 2014-02-22: 2 g via INTRAVENOUS

## 2014-02-22 MED ORDER — ONDANSETRON HCL 4 MG/2ML IJ SOLN
INTRAMUSCULAR | Status: DC | PRN
  Administered 2014-02-22: 4 mg via INTRAVENOUS

## 2014-02-22 MED ORDER — HYDROMORPHONE HCL 1 MG/ML IJ SOLN: 0.2500 mg | INTRAMUSCULAR | Status: DC | PRN

## 2014-02-22 MED ORDER — LACTATED RINGERS IV SOLN
INTRAVENOUS | Status: DC
  Administered 2014-02-22 (×2): via INTRAVENOUS

## 2014-02-22 SURGICAL SUPPLY — 90 items
APL SKNCLS STERI-STRIP NONHPOA (GAUZE/BANDAGES/DRESSINGS)
BENZOIN TINCTURE PRP APPL 2/3 (GAUZE/BANDAGES/DRESSINGS) IMPLANT
BLADE CLIPPER SURG (BLADE) IMPLANT
BLADE SURG 15 STRL LF DISP TIS (BLADE) ×1 IMPLANT
BLADE SURG 15 STRL SS (BLADE) ×4
BLADE VORTEX 6.0 (BLADE) IMPLANT
BUR OVAL 4.0 (BURR) ×4 IMPLANT
CANISTER SUCT 3000ML (MISCELLANEOUS) IMPLANT
CANNULA 5.75X71 LONG (CANNULA) ×4 IMPLANT
CANNULA TWIST IN 8.25X7CM (CANNULA) IMPLANT
CHLORAPREP W/TINT 26ML (MISCELLANEOUS) ×4 IMPLANT
CLOSURE WOUND 1/2 X4 (GAUZE/BANDAGES/DRESSINGS)
DECANTER SPIKE VIAL GLASS SM (MISCELLANEOUS) IMPLANT
DRAPE INCISE IOBAN 66X45 STRL (DRAPES) ×4 IMPLANT
DRAPE STERI 35X30 U-POUCH (DRAPES) ×4 IMPLANT
DRAPE SURG 17X23 STRL (DRAPES) ×4 IMPLANT
DRAPE U 20/CS (DRAPES) ×4 IMPLANT
DRAPE U-SHAPE 47X51 STRL (DRAPES) ×4 IMPLANT
DRAPE U-SHAPE 76X120 STRL (DRAPES) ×8 IMPLANT
DRSG PAD ABDOMINAL 8X10 ST (GAUZE/BANDAGES/DRESSINGS) ×4 IMPLANT
ELECT BLADE 4.0 EZ CLEAN MEGAD (MISCELLANEOUS)
ELECT REM PT RETURN 9FT ADLT (ELECTROSURGICAL) ×4
ELECTRODE BLDE 4.0 EZ CLN MEGD (MISCELLANEOUS) IMPLANT
ELECTRODE REM PT RTRN 9FT ADLT (ELECTROSURGICAL) ×2 IMPLANT
GAUZE SPONGE 4X4 12PLY STRL (GAUZE/BANDAGES/DRESSINGS) ×4 IMPLANT
GAUZE SPONGE 4X4 16PLY XRAY LF (GAUZE/BANDAGES/DRESSINGS) IMPLANT
GAUZE XEROFORM 1X8 LF (GAUZE/BANDAGES/DRESSINGS) ×4 IMPLANT
GLOVE BIO SURGEON STRL SZ 6.5 (GLOVE) ×2 IMPLANT
GLOVE BIO SURGEON STRL SZ7 (GLOVE) ×4 IMPLANT
GLOVE BIO SURGEON STRL SZ7.5 (GLOVE) ×4 IMPLANT
GLOVE BIO SURGEONS STRL SZ 6.5 (GLOVE) ×1
GLOVE BIOGEL PI IND STRL 7.0 (GLOVE) ×3 IMPLANT
GLOVE BIOGEL PI IND STRL 8 (GLOVE) ×2 IMPLANT
GLOVE BIOGEL PI INDICATOR 7.0 (GLOVE) ×4
GLOVE BIOGEL PI INDICATOR 8 (GLOVE) ×2
GLOVE EXAM NITRILE LRG STRL (GLOVE) ×3 IMPLANT
GOWN STRL REUS W/ TWL LRG LVL3 (GOWN DISPOSABLE) ×4 IMPLANT
GOWN STRL REUS W/TWL LRG LVL3 (GOWN DISPOSABLE) ×8
GOWN STRL REUS W/TWL XL LVL3 (GOWN DISPOSABLE) ×4 IMPLANT
GOWN STRL REUS W/TWL XL LVL4 (GOWN DISPOSABLE) ×4 IMPLANT
LASSO CRESCENT QUICKPASS (SUTURE) IMPLANT
LIQUID BAND (GAUZE/BANDAGES/DRESSINGS) IMPLANT
MANIFOLD NEPTUNE II (INSTRUMENTS) ×4 IMPLANT
NDL 1/2 CIR CATGUT .05X1.09 (NEEDLE) IMPLANT
NDL SCORPION MULTI FIRE (NEEDLE) IMPLANT
NDL SUT 6 .5 CRC .975X.05 MAYO (NEEDLE) IMPLANT
NEEDLE 1/2 CIR CATGUT .05X1.09 (NEEDLE) IMPLANT
NEEDLE MAYO TAPER (NEEDLE)
NEEDLE SCORPION MULTI FIRE (NEEDLE) IMPLANT
NS IRRIG 1000ML POUR BTL (IV SOLUTION) IMPLANT
PACK ARTHROSCOPY DSU (CUSTOM PROCEDURE TRAY) ×4 IMPLANT
PACK BASIN DAY SURGERY FS (CUSTOM PROCEDURE TRAY) ×4 IMPLANT
PENCIL BUTTON HOLSTER BLD 10FT (ELECTRODE) IMPLANT
RESECTOR FULL RADIUS 4.2MM (BLADE) ×4 IMPLANT
SLEEVE SCD COMPRESS KNEE MED (MISCELLANEOUS) ×4 IMPLANT
SLING ARM IMMOBILIZER MED (SOFTGOODS) IMPLANT
SLING ARM LRG ADULT FOAM STRAP (SOFTGOODS) IMPLANT
SLING ARM MED ADULT FOAM STRAP (SOFTGOODS) IMPLANT
SLING ARM XL FOAM STRAP (SOFTGOODS) ×3 IMPLANT
SPONGE LAP 4X18 X RAY DECT (DISPOSABLE) IMPLANT
STRIP CLOSURE SKIN 1/2X4 (GAUZE/BANDAGES/DRESSINGS) IMPLANT
SUCTION FRAZIER TIP 10 FR DISP (SUCTIONS) IMPLANT
SUPPORT WRAP ARM LG (MISCELLANEOUS) IMPLANT
SUT 2 FIBERLOOP 20 STRT BLUE (SUTURE)
SUT BONE WAX W31G (SUTURE) IMPLANT
SUT ETHIBOND 2 OS 4 DA (SUTURE) IMPLANT
SUT ETHILON 3 0 PS 1 (SUTURE) ×4 IMPLANT
SUT ETHILON 4 0 PS 2 18 (SUTURE) IMPLANT
SUT FIBERWIRE #2 38 T-5 BLUE (SUTURE)
SUT MNCRL AB 3-0 PS2 18 (SUTURE) IMPLANT
SUT MNCRL AB 4-0 PS2 18 (SUTURE) IMPLANT
SUT PDS AB 0 CT 36 (SUTURE) IMPLANT
SUT PROLENE 3 0 PS 2 (SUTURE) IMPLANT
SUT TIGER TAPE 7 IN WHITE (SUTURE) IMPLANT
SUT VIC AB 0 CT1 27 (SUTURE)
SUT VIC AB 0 CT1 27XBRD ANBCTR (SUTURE) IMPLANT
SUT VIC AB 2-0 SH 27 (SUTURE)
SUT VIC AB 2-0 SH 27XBRD (SUTURE) IMPLANT
SUTURE 2 FIBERLOOP 20 STRT BLU (SUTURE) IMPLANT
SUTURE FIBERWR #2 38 T-5 BLUE (SUTURE) IMPLANT
SYR BULB 3OZ (MISCELLANEOUS) IMPLANT
TAPE FIBER 2MM 7IN #2 BLUE (SUTURE) IMPLANT
TOWEL OR 17X24 6PK STRL BLUE (TOWEL DISPOSABLE) ×4 IMPLANT
TOWEL OR NON WOVEN STRL DISP B (DISPOSABLE) ×4 IMPLANT
TUBE CONNECTING 20'X1/4 (TUBING) ×1
TUBE CONNECTING 20X1/4 (TUBING) ×3 IMPLANT
TUBING ARTHROSCOPY IRRIG 16FT (MISCELLANEOUS) ×4 IMPLANT
WAND STAR VAC 90 (SURGICAL WAND) ×4 IMPLANT
WATER STERILE IRR 1000ML POUR (IV SOLUTION) ×4 IMPLANT
YANKAUER SUCT BULB TIP NO VENT (SUCTIONS) IMPLANT

## 2014-02-22 NOTE — Discharge Instructions (Signed)
Discharge Instructions after Arthroscopic Shoulder Surgery ° ° °A sling has been provided for you. You may remove the sling after 72 hours. The sling may be worn for your protection, if you are in a crowd.  °Use ice on the shoulder intermittently over the first 48 hours after surgery.  °Pain medication has been prescribed for you.  °Use your medication liberally over the first 48 hours, and then begin to taper your use. You may take Extra Strength Tylenol or Tylenol only in place of the pain pills. DO NOT take ANY nonsteroidal anti-inflammatory pain medications: Advil, Motrin, Ibuprofen, Aleve, Naproxen, or Naprosyn.  °You may remove your dressing after two days.  °You may shower 5 days after surgery. The incision CANNOT get wet prior to 5 days. Simply allow the water to wash over the site and then pat dry. Do not rub the incision. Make sure your axilla (armpit) is completely dry after showering.  °Take one aspirin a day for 2 weeks after surgery, unless you have an aspirin sensitivity/allergy or asthma.  °Three to 5 times each day you should perform assisted overhead reaching and external rotation (outward turning) exercises with the operative arm. Both exercises should be done with the non-operative arm used as the "therapist arm" while the operative arm remains relaxed. Ten of each exercise should be done three to five times each day. ° ° ° °Overhead reach is helping to lift your stiff arm up as high as it will go. To stretch your overhead reach, lie flat on your back, relax, and grasp the wrist of the tight shoulder with your opposite hand. Using the power in your opposite arm, bring the stiff arm up as far as it is comfortable. Start holding it for ten seconds and then work up to where you can hold it for a count of 30. Breathe slowly and deeply while the arm is moved. Repeat this stretch ten times, trying to help the arm up a little higher each time.  ° ° ° ° ° °External rotation is turning the arm out to  the side while your elbow stays close to your body. External rotation is best stretched while you are lying on your back. Hold a cane, yardstick, broom handle, or dowel in both hands. Bend both elbows to a right angle. Use steady, gentle force from your normal arm to rotate the hand of the stiff shoulder out away from your body. Continue the rotation as far as it will go comfortably, holding it there for a count of 10. Repeat this exercise ten times.  ° ° ° °Please call [phone] during normal business hours or [phone] after hours for any problems. Including the following: ° °- excessive redness of the incisions °- drainage for more than 4 days °- fever of more than 101.5 F ° °*Please note that pain medications will not be refilled after hours or on weekends. ° ° °Regional Anesthesia Blocks ° °1. Numbness or the inability to move the "blocked" extremity may last from 3-48 hours after placement. The length of time depends on the medication injected and your individual response to the medication. If the numbness is not going away after 48 hours, call your surgeon. ° °2. The extremity that is blocked will need to be protected until the numbness is gone and the  Strength has returned. Because you cannot feel it, you will need to take extra care to avoid injury. Because it may be weak, you may have difficulty moving it or using   it. You may not know what position it is in without looking at it while the block is in effect.  3. For blocks in the legs and feet, returning to weight bearing and walking needs to be done carefully. You will need to wait until the numbness is entirely gone and the strength has returned. You should be able to move your leg and foot normally before you try and bear weight or walk. You will need someone to be with you when you first try to ensure you do not fall and possibly risk injury.  4. Bruising and tenderness at the needle site are common side effects and will resolve in a few  days.  5. Persistent numbness or new problems with movement should be communicated to the surgeon or the [place] [phone] [place] [phone]).    Post Anesthesia Home Care Instructions  Activity: Get plenty of rest for the remainder of the day. A responsible adult should stay with you for 24 hours following the procedure.  For the next 24 hours, DO NOT: -Drive a car -Paediatric nurse -Drink alcoholic beverages -Take any medication unless instructed by your physician -Make any legal decisions or sign important papers.  Meals: Start with liquid foods such as gelatin or soup. Progress to regular foods as tolerated. Avoid greasy, spicy, heavy foods. If nausea and/or vomiting occur, drink only clear liquids until the nausea and/or vomiting subsides. Call your physician if vomiting continues.  Special Instructions/Symptoms: Your throat may feel dry or sore from the anesthesia or the breathing tube placed in your throat during surgery. If this causes discomfort, gargle with warm salt water. The discomfort should disappear within 24 hours.

## 2014-02-22 NOTE — Transfer of Care (Signed)
Immediate Anesthesia Transfer of Care Note  Patient: Travis Arnold  Procedure(s) Performed: Procedure(s): LEFT SHOULDER ARTHROSCOPY WITH DEBRIDEMENT, CHONDROMALACIA , BICEPS TENOTOMY  (Left)  Patient Location: PACU  Anesthesia Type:GA combined with regional for post-op pain  Level of Consciousness: awake and patient cooperative  Airway & Oxygen Therapy: Patient Spontanous Breathing and Patient connected to face mask oxygen  Post-op Assessment: Report given to PACU RN and Post -op Vital signs reviewed and stable  Post vital signs: Reviewed and stable  Complications: No apparent anesthesia complications

## 2014-02-22 NOTE — Anesthesia Preprocedure Evaluation (Addendum)
Anesthesia Evaluation  Patient identified by MRN, date of birth, ID band Patient awake    Reviewed: Allergy & Precautions, NPO status , Patient's Chart, lab work & pertinent test results, Unable to perform ROS - Chart review only  Airway Mallampati: II  TM Distance: >3 FB Neck ROM: Limited   Comment: Can extend but is uncomfortable Dental  (+) Teeth Intact   Pulmonary sleep apnea ,  breath sounds clear to auscultation        Cardiovascular hypertension, Rhythm:Regular Rate:Normal     Neuro/Psych  Headaches, S/P multi level cervical fusion  Neuromuscular disease    GI/Hepatic Neg liver ROS, GERD-  ,  Endo/Other    Renal/GU negative Renal ROS     Musculoskeletal  (+) Arthritis -,   Abdominal   Peds  Hematology   Anesthesia Other Findings   Reproductive/Obstetrics                            Anesthesia Physical Anesthesia Plan  ASA: II  Anesthesia Plan: General   Post-op Pain Management:    Induction: Intravenous  Airway Management Planned: Oral ETT and Video Laryngoscope Planned  Additional Equipment:   Intra-op Plan:   Post-operative Plan: Extubation in OR  Informed Consent: I have reviewed the patients History and Physical, chart, labs and discussed the procedure including the risks, benefits and alternatives for the proposed anesthesia with the patient or authorized representative who has indicated his/her understanding and acceptance.     Plan Discussed with: CRNA and Surgeon  Anesthesia Plan Comments:        Anesthesia Quick Evaluation

## 2014-02-22 NOTE — H&P (Signed)
Travis Arnold is an 64 y.o. male.   Chief Complaint: L shoulder pain  HPI: L shoulder pain with chondromalacia and biceps tendonitis.  Failed conservative treatment.  Past Medical History  Diagnosis Date  . Hypertension   . Bronchitis     hx  . GERD (gastroesophageal reflux disease)   . Cancer     skin melanoma back  . Arthritis   . History of stress test 2000    many yrs. ago due to family history of several cardiac deaths in family prior to age 16   . Sleep disturbance     given CPAP machine, but needs new study, doesn't use CPAP currently   . Headache(784.0)   . Wears hearing aid     both ears, but mostly deaf  . Deaf   . Sleep apnea 2006    does not use cpap-pcp says not needed-mild    Past Surgical History  Procedure Laterality Date  . Cataract extraction Left 11  . Cervical disc surgery  98.11.    x2  . Shoulder arthroscopy Bilateral 09    spurs  . Shoulder arthroscopy Right 10    spurs  . Hernia repair Bilateral [id number]    several both sides  . Tonsillectomy    . Eye surgery Left     detached retina  . Posterior cervical fusion/foraminotomy N/A 02/18/2013    Procedure: POSTERIOR CERVICAL FUSION/C3 - C4, C4 - C5, C5 - C6, C6 - C7, C7 - T1;  Surgeon: Sinclair Ship, MD;  Location: [facility];  Service: Orthopedics;  Laterality: N/A;  Posterior cervical fusion, cervical 3-4, cervical 4-5, cervical 5-6, cervical 6-7, cervical 7-thoracic 1 with instrumentation, allograft.    History reviewed. No pertinent family history. Social History:  reports that he has never smoked. He does not have any smokeless tobacco history on file. He reports that he drinks about 12.6 oz of alcohol per week. He reports that he does not use illicit drugs.  Allergies: No Known Allergies  Medications Prior to Admission  Medication Sig Dispense Refill  . aspirin 81 MG tablet Take 81 mg by mouth daily.    Marland Kitchen atorvastatin (LIPITOR) 20 MG tablet Take 20 mg by mouth every evening.    Marland Kitchen  doxazosin (CARDURA) 4 MG tablet Take 4 mg by mouth every evening.    Marland Kitchen esomeprazole (NEXIUM) 40 MG capsule Take 40 mg by mouth See admin instructions. Takes 1 tablet every morning, but can take another tablet during the day if needed for acid reflux.    . finasteride (PROSCAR) 5 MG tablet Take 5 mg by mouth every morning.    Marland Kitchen lisinopril-hydrochlorothiazide (PRINZIDE,ZESTORETIC) 10-12.5 MG per tablet Take 1 tablet by mouth every morning.     . loperamide (IMODIUM A-D) 2 MG tablet Take 4 mg by mouth 4 (four) times daily as needed for diarrhea or loose stools.    . meloxicam (MOBIC) 15 MG tablet Take 15 mg by mouth daily.    . metoprolol (LOPRESSOR) 50 MG tablet Take 50 mg by mouth every morning.    . carisoprodol (SOMA) 350 MG tablet Take 350 mg by mouth 4 (four) times daily as needed for muscle spasms.    . meclizine (ANTIVERT) 50 MG tablet Take 25 mg by mouth 3 (three) times daily as needed for dizziness.     . traMADol (ULTRAM) 50 MG tablet Take by mouth every 6 (six) hours as needed.      Results for orders placed or  performed during the hospital encounter of 02/22/14 (from the past 48 hour(s))  I-STAT, chem 8     Status: Abnormal   Collection Time: 02/22/14 10:36 AM  Result Value Ref Range   Sodium 141 135 - 145 mmol/L   Potassium 4.3 3.5 - 5.1 mmol/L   Chloride 104 96 - 112 mEq/L   BUN 14 6 - 23 mg/dL   Creatinine, Ser 0.80 0.50 - 1.35 mg/dL   Glucose, Bld 109 (H) 70 - 99 mg/dL   Calcium, Ion 1.15 1.13 - 1.30 mmol/L   TCO2 21 0 - 100 mmol/L   Hemoglobin 16.7 13.0 - 17.0 g/dL   HCT 49.0 39.0 - 52.0 %   No results found.  Review of Systems  All other systems reviewed and are negative.   Blood pressure 145/88, pulse 60, temperature 97.6 F (36.4 C), temperature source Oral, resp. rate 18, height 6\' 2"  (1.88 m), weight 84.369 kg (186 lb), SpO2 99 %. Physical Exam  Constitutional: He is oriented to person, place, and time. He appears well-developed and well-nourished.  HENT:   Head: Atraumatic.  Eyes: EOM are normal.  Cardiovascular: Intact distal pulses.   Respiratory: Effort normal.  Musculoskeletal:  L shoulder pain with ROM.  No warmth or erythema.  Neurological: He is alert and oriented to person, place, and time.  Skin: Skin is warm and dry.  Psychiatric: He has a normal mood and affect.     Assessment/Plan L shoulder pain with chondromalacia and biceps tendonitis.  Plan arthroscopic debridement, chondroplasty, possible biceps tenotomy. Risks / benefits of surgery discussed Consent on chart  NPO for OR Preop antibiotics   Carlise Stofer WILLIAM 02/22/2014, 11:26 AM

## 2014-02-22 NOTE — Anesthesia Postprocedure Evaluation (Signed)
  Anesthesia Post-op Note  Patient: Travis Arnold  Procedure(s) Performed: Procedure(s): LEFT SHOULDER ARTHROSCOPY WITH DEBRIDEMENT, CHONDROMALACIA & subacromial decompression  (Left)  Patient Location: PACU  Anesthesia Type:GA combined with regional for post-op pain  Level of Consciousness: awake and alert   Airway and Oxygen Therapy: Patient Spontanous Breathing  Post-op Pain: none  Post-op Assessment: Post-op Vital signs reviewed  Post-op Vital Signs: stable  Last Vitals:  Filed Vitals:   02/22/14 1359  BP: 133/71  Pulse: 89  Temp: 36.6 C  Resp: 18    Complications: No apparent anesthesia complications

## 2014-02-22 NOTE — Progress Notes (Signed)
Assisted Dr. Massagee with left, ultrasound guided, supraclavicular block. Side rails up, monitors on throughout procedure. See vital signs in flow sheet. Tolerated Procedure well. 

## 2014-02-22 NOTE — Anesthesia Procedure Notes (Addendum)
Anesthesia Regional Block:  Interscalene brachial plexus block  Pre-Anesthetic Checklist: ,, timeout performed, Correct Patient, Correct Site, Correct Laterality, Correct Procedure, Correct Position, site marked, Risks and benefits discussed,  Surgical consent,  Pre-op evaluation,  At surgeon's request and post-op pain management  Laterality: Left  Prep: chloraprep       Needles:   Needle Type: Echogenic Needle     Needle Length: 9cm 9 cm   Needle insertion depth: 5 cm   Additional Needles:  Procedures: ultrasound guided (picture in chart) Interscalene brachial plexus block Narrative:  Start time: 02/22/2014 11:05 AM End time: 02/22/2014 11:20 AM Injection made incrementally with aspirations every 5 mL.  Performed by: Personally  Anesthesiologist: MASSAGEE, TERRY  Additional Notes: Tolerated Well   Procedure Name: Intubation Date/Time: 02/22/2014 11:42 AM Performed by: Lieutenant Diego Pre-anesthesia Checklist: Patient identified, Emergency Drugs available, Suction available and Patient being monitored Patient Re-evaluated:Patient Re-evaluated prior to inductionOxygen Delivery Method: Circle System Utilized Preoxygenation: Pre-oxygenation with 100% oxygen Intubation Type: IV induction Ventilation: Mask ventilation without difficulty Laryngoscope size: blind per TM. Tube type: Oral Tube size: 7.0 mm Number of attempts: 1 Airway Equipment and Method: stylet and oral airway Placement Confirmation: ETT inserted through vocal cords under direct vision,  positive ETCO2 and breath sounds checked- equal and bilateral Secured at: 24 cm Tube secured with: Tape Dental Injury: Teeth and Oropharynx as per pre-operative assessment  Comments: Blind intubation per TM

## 2014-02-22 NOTE — Op Note (Signed)
Procedure(s): LEFT SHOULDER ARTHROSCOPY procedure Note  Travis Arnold male 64 y.o. 02/22/2014  Procedure(s) and Anesthesia Type:   #1 left shoulder arthroscopic debridement glenohumeral arthritis, type I SLAP, extensive bursitis   #2 left shoulder arthroscopic subacromial decompression  Surgeon(s) and Role:    * Nita Sells, MD - Primary     Surgeon: Nita Sells   Assistants: Jeanmarie Hubert PA-C [facility] was present and scrubbed throughout the procedure and was essential in positioning, assisting with the camera and instrumentation,, and closure)  Anesthesia: General endotracheal anesthesia with preoperative interscalene block given by the attending anesthesiologist     Procedure Detail   Estimated Blood Loss: Min         Drains: none  Blood Given: none         Specimens: none        Complications:  * No complications entered in OR log *         Disposition: PACU - hemodynamically stable.         Condition: stable    Procedure:   INDICATIONS FOR SURGERY: The patient is 64 y.o. male who has had a long history of bilateral shoulder problems. He has had recurrent left shoulder pain which is failed extensive conservative management with rest activity modification injections and NSAIDs. He was to go forward with surgical management. He was found to have moderate arthritis on MRIand joint space narrowing was not felt to be at the point where total shoulder arthroplasty would be necessary. He was indicated for arthroscopic treatment to try and decrease mechanical symptoms and pain and restore function. He understood risks benefits alternatives procedure wished to go forward with surgery.  OPERATIVE FINDINGS: Examination under anesthesia: Mild stiffness in forward flexion 150. No stiffness in external Rotation.  DESCRIPTION OF PROCEDURE: The patient was identified in preoperative  holding area where I personally marked the operative site after   verifying site, side, and procedure with the patient. An interscalene block was given by the attending anesthesiologist the holding area.  The patient was taken back to the operating room where general anesthesia was induced without complication and was placed in the beach-chair position with the back  elevated about 60 degrees and all extremities and head and neck carefully padded and  positioned.   The left upper extremity was then prepped and  draped in a standard sterile fashion. The appropriate time-out  procedure was carried out. The patient did receive IV antibiotics  within 30 minutes of incision.   A small posterior portal incision was made and the arthroscope was introduced into the joint. An anterior portal was then established above the subscapularis using needle localization. Small cannula was placed anteriorly. Diagnostic arthroscopy was then carried out.  He was noted to have some degenerative tearing superior labrum without frank detachment. This was debrided back to healthy labrum. There is no involvement of the biceps anchor. The biceps tendon was pulled into the joint and had mild tenosynovitis but no partial tearing. Therefore a tenotomy was not performed. The rotator cuff was carefully examined anterior to posterior and found to be completely intact. Joint surfaces were examined. The glenoid was largely intact with some grade 1 changes but the humeral head had diffuse grade 3 and 4 changes centrally with a large chondral flap which was unstable. The shaver was used to debride the unstable chondral flap and the remaining edges of transition were stable with no further loose bodies or chondral flaps. The camera was moved to  the anterior portal and the shaver was used to the posterior portal to further debride the posterior aspect of the chondromalacia on the humeral head.   The arthroscope was then introduced into the subacromial space a standard lateral portal was established. He  was noted to have extensive bursitis and subacromial space. This was extensively debrided with a shaver until the bursal surface the rotator cuff could be carefully examined. It was examined posterior to anterior and found to be completely intact with no partial or full-thickness tearing. The anterior acromion was examined and found to be slightly downsloping with a small anterior acromial spur with some fraying of the coracoacromial ligament. This was addressed first exposing bone spur with ArthroCare and then resecting the small anterior spur with a 4 mm bur which was first done from the lateral portal and then touched up from the posterior portal to ensure complete decompression.  The arthroscopic equipment was removed from the joint and the portals were closed with 3-0 nylon in an interrupted fashion. Sterile dressings were then applied including Xeroform 4 x 4's ABDs and tape. The patient was then allowed to awaken from general anesthesia, placed in a sling, transferred to the stretcher and taken to the recovery room in stable condition.   POSTOPERATIVE PLAN: The patient will be discharged home today and will followup in one week for suture removal and wound check.  He can get into physical therapy right away.

## 2014-02-23 ENCOUNTER — Encounter (HOSPITAL_BASED_OUTPATIENT_CLINIC_OR_DEPARTMENT_OTHER): Payer: Self-pay | Admitting: Orthopedic Surgery

## 2016-09-28 ENCOUNTER — Other Ambulatory Visit: Payer: Medicare Other

## 2016-09-28 ENCOUNTER — Other Ambulatory Visit: Payer: Self-pay | Admitting: Physician Assistant

## 2016-09-28 DIAGNOSIS — M545 Low back pain: Secondary | ICD-10-CM

## 2016-09-28 DIAGNOSIS — M47816 Spondylosis without myelopathy or radiculopathy, lumbar region: Secondary | ICD-10-CM

## 2016-09-28 DIAGNOSIS — G894 Chronic pain syndrome: Secondary | ICD-10-CM

## 2016-11-23 ENCOUNTER — Ambulatory Visit
Admission: RE | Admit: 2016-11-23 | Discharge: 2016-11-23 | Disposition: A | Discharge status: Home / Self Care | Payer: Medicare Other | Source: Ambulatory Visit | Attending: Physician Assistant | Admitting: Physician Assistant

## 2016-11-23 ENCOUNTER — Other Ambulatory Visit: Payer: Self-pay | Admitting: Physician Assistant

## 2016-11-23 DIAGNOSIS — M545 Low back pain: Secondary | ICD-10-CM

## 2016-11-23 DIAGNOSIS — M47816 Spondylosis without myelopathy or radiculopathy, lumbar region: Secondary | ICD-10-CM

## 2016-11-23 DIAGNOSIS — G894 Chronic pain syndrome: Secondary | ICD-10-CM

## 2016-12-07 ENCOUNTER — Other Ambulatory Visit: Payer: Self-pay | Admitting: Pain Medicine

## 2016-12-07 DIAGNOSIS — M545 Low back pain: Secondary | ICD-10-CM

## 2016-12-26 ENCOUNTER — Ambulatory Visit
Admission: RE | Admit: 2016-12-26 | Discharge: 2016-12-26 | Disposition: A | Discharge status: Home / Self Care | Payer: Medicare Other | Source: Ambulatory Visit | Attending: Pain Medicine | Admitting: Pain Medicine

## 2016-12-26 DIAGNOSIS — M545 Low back pain: Secondary | ICD-10-CM

## 2018-12-24 ENCOUNTER — Other Ambulatory Visit: Payer: Self-pay | Admitting: Orthopedic Surgery

## 2018-12-26 ENCOUNTER — Other Ambulatory Visit: Payer: Self-pay | Admitting: Orthopedic Surgery

## 2018-12-29 ENCOUNTER — Other Ambulatory Visit: Payer: Self-pay | Admitting: Orthopedic Surgery

## 2018-12-31 ENCOUNTER — Other Ambulatory Visit: Payer: Self-pay

## 2018-12-31 ENCOUNTER — Encounter (HOSPITAL_BASED_OUTPATIENT_CLINIC_OR_DEPARTMENT_OTHER): Payer: Self-pay | Admitting: *Deleted

## 2019-01-01 ENCOUNTER — Encounter (HOSPITAL_BASED_OUTPATIENT_CLINIC_OR_DEPARTMENT_OTHER)
Admission: RE | Admit: 2019-01-01 | Discharge: 2019-01-01 | Disposition: A | Discharge status: Home / Self Care | Payer: Medicare Other | Source: Ambulatory Visit | Attending: Orthopedic Surgery | Admitting: Orthopedic Surgery

## 2019-01-01 ENCOUNTER — Other Ambulatory Visit (HOSPITAL_COMMUNITY)
Admission: RE | Admit: 2019-01-01 | Discharge: 2019-01-01 | Disposition: A | Discharge status: Home / Self Care | Payer: Medicare Other | Source: Ambulatory Visit | Attending: Orthopedic Surgery | Admitting: Orthopedic Surgery

## 2019-01-01 DIAGNOSIS — Z981 Arthrodesis status: Secondary | ICD-10-CM | POA: Diagnosis not present

## 2019-01-01 DIAGNOSIS — Z7982 Long term (current) use of aspirin: Secondary | ICD-10-CM | POA: Diagnosis not present

## 2019-01-01 DIAGNOSIS — Z9119 Patient's noncompliance with other medical treatment and regimen: Secondary | ICD-10-CM | POA: Diagnosis not present

## 2019-01-01 DIAGNOSIS — G709 Myoneural disorder, unspecified: Secondary | ICD-10-CM | POA: Diagnosis not present

## 2019-01-01 DIAGNOSIS — S43431A Superior glenoid labrum lesion of right shoulder, initial encounter: Secondary | ICD-10-CM | POA: Diagnosis not present

## 2019-01-01 DIAGNOSIS — I10 Essential (primary) hypertension: Secondary | ICD-10-CM | POA: Diagnosis not present

## 2019-01-01 DIAGNOSIS — Z79899 Other long term (current) drug therapy: Secondary | ICD-10-CM | POA: Diagnosis not present

## 2019-01-01 DIAGNOSIS — Z9842 Cataract extraction status, left eye: Secondary | ICD-10-CM | POA: Diagnosis not present

## 2019-01-01 DIAGNOSIS — H9190 Unspecified hearing loss, unspecified ear: Secondary | ICD-10-CM | POA: Diagnosis not present

## 2019-01-01 DIAGNOSIS — M19011 Primary osteoarthritis, right shoulder: Secondary | ICD-10-CM | POA: Diagnosis not present

## 2019-01-01 DIAGNOSIS — Z01812 Encounter for preprocedural laboratory examination: Secondary | ICD-10-CM | POA: Insufficient documentation

## 2019-01-01 DIAGNOSIS — K219 Gastro-esophageal reflux disease without esophagitis: Secondary | ICD-10-CM | POA: Diagnosis not present

## 2019-01-01 DIAGNOSIS — M65811 Other synovitis and tenosynovitis, right shoulder: Secondary | ICD-10-CM | POA: Diagnosis not present

## 2019-01-01 DIAGNOSIS — Z20828 Contact with and (suspected) exposure to other viral communicable diseases: Secondary | ICD-10-CM | POA: Diagnosis not present

## 2019-01-01 DIAGNOSIS — G4730 Sleep apnea, unspecified: Secondary | ICD-10-CM | POA: Diagnosis not present

## 2019-01-01 DIAGNOSIS — M94211 Chondromalacia, right shoulder: Secondary | ICD-10-CM | POA: Diagnosis not present

## 2019-01-01 DIAGNOSIS — R519 Headache, unspecified: Secondary | ICD-10-CM | POA: Diagnosis not present

## 2019-01-01 DIAGNOSIS — X58XXXA Exposure to other specified factors, initial encounter: Secondary | ICD-10-CM | POA: Diagnosis not present

## 2019-01-01 DIAGNOSIS — E785 Hyperlipidemia, unspecified: Secondary | ICD-10-CM | POA: Diagnosis not present

## 2019-01-01 DIAGNOSIS — M7581 Other shoulder lesions, right shoulder: Secondary | ICD-10-CM | POA: Diagnosis not present

## 2019-01-01 DIAGNOSIS — Z85820 Personal history of malignant melanoma of skin: Secondary | ICD-10-CM | POA: Diagnosis not present

## 2019-01-01 DIAGNOSIS — Z791 Long term (current) use of non-steroidal anti-inflammatories (NSAID): Secondary | ICD-10-CM | POA: Diagnosis not present

## 2019-01-01 LAB — BASIC METABOLIC PANEL
Anion gap: 9 (ref 5–15)
BUN: 21 mg/dL (ref 8–23)
CO2: 25 mmol/L (ref 22–32)
Calcium: 9.3 mg/dL (ref 8.9–10.3)
Chloride: 105 mmol/L (ref 98–111)
Creatinine, Ser: 0.94 mg/dL (ref 0.61–1.24)
GFR calc Af Amer: >60 mL/min (ref >60)
GFR calc non Af Amer: >60 mL/min (ref >60)
Glucose, Bld: 98 mg/dL (ref 70–99)
Potassium: 4.5 mmol/L (ref 3.5–5.1)
Sodium: 139 mmol/L (ref 135–145)

## 2019-01-01 NOTE — Progress Notes (Signed)

## 2019-01-02 LAB — NOVEL CORONAVIRUS, NAA (HOSP ORDER, SEND-OUT TO REF LAB; TAT 18-24 HRS): SARS-CoV-2, NAA: NOT DETECTED

## 2019-01-05 ENCOUNTER — Ambulatory Visit (HOSPITAL_BASED_OUTPATIENT_CLINIC_OR_DEPARTMENT_OTHER)
Admission: RE | Admit: 2019-01-05 | Discharge: 2019-01-05 | Disposition: A | Discharge status: Home / Self Care | Payer: Medicare Other | Attending: Orthopedic Surgery | Admitting: Orthopedic Surgery

## 2019-01-05 ENCOUNTER — Encounter (HOSPITAL_BASED_OUTPATIENT_CLINIC_OR_DEPARTMENT_OTHER)
Admission: RE | Disposition: A | Discharge status: Home / Self Care | Payer: Self-pay | Source: Home / Self Care | Attending: Orthopedic Surgery

## 2019-01-05 ENCOUNTER — Other Ambulatory Visit: Payer: Self-pay

## 2019-01-05 ENCOUNTER — Encounter (HOSPITAL_BASED_OUTPATIENT_CLINIC_OR_DEPARTMENT_OTHER): Payer: Self-pay

## 2019-01-05 ENCOUNTER — Ambulatory Visit (HOSPITAL_BASED_OUTPATIENT_CLINIC_OR_DEPARTMENT_OTHER): Payer: Medicare Other | Admitting: Anesthesiology

## 2019-01-05 DIAGNOSIS — M7581 Other shoulder lesions, right shoulder: Secondary | ICD-10-CM | POA: Diagnosis not present

## 2019-01-05 DIAGNOSIS — I10 Essential (primary) hypertension: Secondary | ICD-10-CM | POA: Insufficient documentation

## 2019-01-05 DIAGNOSIS — G709 Myoneural disorder, unspecified: Secondary | ICD-10-CM | POA: Insufficient documentation

## 2019-01-05 DIAGNOSIS — Z85820 Personal history of malignant melanoma of skin: Secondary | ICD-10-CM | POA: Insufficient documentation

## 2019-01-05 DIAGNOSIS — Z981 Arthrodesis status: Secondary | ICD-10-CM | POA: Insufficient documentation

## 2019-01-05 DIAGNOSIS — H9190 Unspecified hearing loss, unspecified ear: Secondary | ICD-10-CM | POA: Insufficient documentation

## 2019-01-05 DIAGNOSIS — M65811 Other synovitis and tenosynovitis, right shoulder: Secondary | ICD-10-CM | POA: Insufficient documentation

## 2019-01-05 DIAGNOSIS — E785 Hyperlipidemia, unspecified: Secondary | ICD-10-CM | POA: Insufficient documentation

## 2019-01-05 DIAGNOSIS — R519 Headache, unspecified: Secondary | ICD-10-CM | POA: Insufficient documentation

## 2019-01-05 DIAGNOSIS — G4730 Sleep apnea, unspecified: Secondary | ICD-10-CM | POA: Insufficient documentation

## 2019-01-05 DIAGNOSIS — Z9842 Cataract extraction status, left eye: Secondary | ICD-10-CM | POA: Insufficient documentation

## 2019-01-05 DIAGNOSIS — M19011 Primary osteoarthritis, right shoulder: Secondary | ICD-10-CM | POA: Insufficient documentation

## 2019-01-05 DIAGNOSIS — Z9119 Patient's noncompliance with other medical treatment and regimen: Secondary | ICD-10-CM | POA: Insufficient documentation

## 2019-01-05 DIAGNOSIS — X58XXXA Exposure to other specified factors, initial encounter: Secondary | ICD-10-CM | POA: Insufficient documentation

## 2019-01-05 DIAGNOSIS — Z79899 Other long term (current) drug therapy: Secondary | ICD-10-CM | POA: Insufficient documentation

## 2019-01-05 DIAGNOSIS — S43431A Superior glenoid labrum lesion of right shoulder, initial encounter: Secondary | ICD-10-CM | POA: Insufficient documentation

## 2019-01-05 DIAGNOSIS — K219 Gastro-esophageal reflux disease without esophagitis: Secondary | ICD-10-CM | POA: Insufficient documentation

## 2019-01-05 DIAGNOSIS — M94211 Chondromalacia, right shoulder: Secondary | ICD-10-CM | POA: Insufficient documentation

## 2019-01-05 DIAGNOSIS — Z7982 Long term (current) use of aspirin: Secondary | ICD-10-CM | POA: Insufficient documentation

## 2019-01-05 DIAGNOSIS — Z791 Long term (current) use of non-steroidal anti-inflammatories (NSAID): Secondary | ICD-10-CM | POA: Insufficient documentation

## 2019-01-05 HISTORY — PX: SHOULDER ARTHROSCOPY: SHX128

## 2019-01-05 HISTORY — DX: Primary osteoarthritis, right shoulder: M19.011

## 2019-01-05 SURGERY — ARTHROSCOPY, SHOULDER
Anesthesia: General | Site: Shoulder | Laterality: Right

## 2019-01-05 MED ORDER — OXYCODONE-ACETAMINOPHEN 5-325 MG PO TABS: 1.0000 | ORAL_TABLET | ORAL | 0 refills | Status: DC | PRN

## 2019-01-05 MED ORDER — SUCCINYLCHOLINE CHLORIDE 200 MG/10ML IV SOSY
PREFILLED_SYRINGE | INTRAVENOUS | Status: AC
  Filled 2019-01-05: qty 10

## 2019-01-05 MED ORDER — FENTANYL CITRATE (PF) 100 MCG/2ML IJ SOLN: 25.0000 ug | INTRAMUSCULAR | Status: DC | PRN

## 2019-01-05 MED ORDER — OXYCODONE HCL 5 MG/5ML PO SOLN: 5.0000 mg | Freq: Once | ORAL | Status: DC | PRN

## 2019-01-05 MED ORDER — CEFAZOLIN SODIUM-DEXTROSE 2-4 GM/100ML-% IV SOLN
INTRAVENOUS | Status: AC
  Filled 2019-01-05: qty 100

## 2019-01-05 MED ORDER — ONDANSETRON HCL 4 MG/2ML IJ SOLN: 4.0000 mg | Freq: Once | INTRAMUSCULAR | Status: DC | PRN

## 2019-01-05 MED ORDER — OXYCODONE HCL 5 MG PO TABS: 5.0000 mg | ORAL_TABLET | Freq: Once | ORAL | Status: DC | PRN

## 2019-01-05 MED ORDER — TRIAMCINOLONE ACETONIDE 40 MG/ML IJ SUSP
INTRAMUSCULAR | Status: AC
  Filled 2019-01-05: qty 5

## 2019-01-05 MED ORDER — BUPIVACAINE HCL (PF) 0.25 % IJ SOLN
INTRAMUSCULAR | Status: DC | PRN
  Administered 2019-01-05: 4 mL via INTRA_ARTICULAR

## 2019-01-05 MED ORDER — LIDOCAINE 2% (20 MG/ML) 5 ML SYRINGE
INTRAMUSCULAR | Status: AC
  Filled 2019-01-05: qty 5

## 2019-01-05 MED ORDER — CHLORHEXIDINE GLUCONATE 4 % EX LIQD: 60.0000 mL | Freq: Once | CUTANEOUS | Status: DC

## 2019-01-05 MED ORDER — PROPOFOL 10 MG/ML IV BOLUS
INTRAVENOUS | Status: DC | PRN
  Administered 2019-01-05: 20 mg via INTRAVENOUS
  Administered 2019-01-05: 180 mg via INTRAVENOUS

## 2019-01-05 MED ORDER — ROCURONIUM BROMIDE 10 MG/ML (PF) SYRINGE
PREFILLED_SYRINGE | INTRAVENOUS | Status: DC | PRN
  Administered 2019-01-05: 60 mg via INTRAVENOUS

## 2019-01-05 MED ORDER — FENTANYL CITRATE (PF) 100 MCG/2ML IJ SOLN
50.0000 ug | INTRAMUSCULAR | Status: DC | PRN
  Administered 2019-01-05: 50 ug via INTRAVENOUS

## 2019-01-05 MED ORDER — FENTANYL CITRATE (PF) 100 MCG/2ML IJ SOLN
INTRAMUSCULAR | Status: AC
  Filled 2019-01-05: qty 2

## 2019-01-05 MED ORDER — SUGAMMADEX SODIUM 200 MG/2ML IV SOLN
INTRAVENOUS | Status: DC | PRN
  Administered 2019-01-05: 160 mg via INTRAVENOUS

## 2019-01-05 MED ORDER — TRIAMCINOLONE ACETONIDE 40 MG/ML IJ SUSP
INTRAMUSCULAR | Status: DC | PRN
  Administered 2019-01-05: 40 mg

## 2019-01-05 MED ORDER — DEXAMETHASONE SODIUM PHOSPHATE 10 MG/ML IJ SOLN
INTRAMUSCULAR | Status: AC
  Filled 2019-01-05: qty 1

## 2019-01-05 MED ORDER — BUPIVACAINE LIPOSOME 1.3 % IJ SUSP
INTRAMUSCULAR | Status: DC | PRN
  Administered 2019-01-05: 10 mL via PERINEURAL

## 2019-01-05 MED ORDER — SODIUM CHLORIDE 0.9 % IR SOLN
Status: DC | PRN
  Administered 2019-01-05: 1

## 2019-01-05 MED ORDER — EPHEDRINE SULFATE 50 MG/ML IJ SOLN
INTRAMUSCULAR | Status: DC | PRN
  Administered 2019-01-05: 15 mg via INTRAVENOUS
  Administered 2019-01-05 (×2): 10 mg via INTRAVENOUS

## 2019-01-05 MED ORDER — MIDAZOLAM HCL 2 MG/2ML IJ SOLN
INTRAMUSCULAR | Status: AC
  Filled 2019-01-05: qty 2

## 2019-01-05 MED ORDER — CEFAZOLIN SODIUM-DEXTROSE 2-4 GM/100ML-% IV SOLN
2.0000 g | INTRAVENOUS | Status: AC
  Administered 2019-01-05: 2 g via INTRAVENOUS

## 2019-01-05 MED ORDER — LIDOCAINE HCL (CARDIAC) PF 100 MG/5ML IV SOSY
PREFILLED_SYRINGE | INTRAVENOUS | Status: DC | PRN
  Administered 2019-01-05: 80 mg via INTRAVENOUS

## 2019-01-05 MED ORDER — ONDANSETRON HCL 4 MG/2ML IJ SOLN
INTRAMUSCULAR | Status: DC | PRN
  Administered 2019-01-05: 4 mg via INTRAVENOUS

## 2019-01-05 MED ORDER — FENTANYL CITRATE (PF) 100 MCG/2ML IJ SOLN
INTRAMUSCULAR | Status: DC | PRN
  Administered 2019-01-05: 100 ug via INTRAVENOUS

## 2019-01-05 MED ORDER — ONDANSETRON HCL 4 MG/2ML IJ SOLN
INTRAMUSCULAR | Status: AC
  Filled 2019-01-05: qty 2

## 2019-01-05 MED ORDER — LACTATED RINGERS IV SOLN
INTRAVENOUS | Status: DC
  Administered 2019-01-05: 11:00:00 via INTRAVENOUS

## 2019-01-05 MED ORDER — PHENYLEPHRINE 40 MCG/ML (10ML) SYRINGE FOR IV PUSH (FOR BLOOD PRESSURE SUPPORT)
PREFILLED_SYRINGE | INTRAVENOUS | Status: DC | PRN
  Administered 2019-01-05: 120 ug via INTRAVENOUS
  Administered 2019-01-05: 200 ug via INTRAVENOUS
  Administered 2019-01-05: 80 ug via INTRAVENOUS

## 2019-01-05 MED ORDER — MIDAZOLAM HCL 2 MG/2ML IJ SOLN
1.0000 mg | INTRAMUSCULAR | Status: DC | PRN
  Administered 2019-01-05: 1 mg via INTRAVENOUS

## 2019-01-05 MED ORDER — PROPOFOL 10 MG/ML IV BOLUS
INTRAVENOUS | Status: AC
  Filled 2019-01-05: qty 20

## 2019-01-05 MED ORDER — MEPERIDINE HCL 25 MG/ML IJ SOLN: 6.2500 mg | INTRAMUSCULAR | Status: DC | PRN

## 2019-01-05 MED ORDER — BUPIVACAINE-EPINEPHRINE (PF) 0.5% -1:200000 IJ SOLN
INTRAMUSCULAR | Status: DC | PRN
  Administered 2019-01-05: 15 mL via PERINEURAL

## 2019-01-05 MED ORDER — PHENYLEPHRINE HCL-NACL 10-0.9 MG/250ML-% IV SOLN
INTRAVENOUS | Status: DC | PRN
  Administered 2019-01-05: 50 ug/min via INTRAVENOUS

## 2019-01-05 MED ORDER — DEXAMETHASONE SODIUM PHOSPHATE 10 MG/ML IJ SOLN
INTRAMUSCULAR | Status: DC | PRN
  Administered 2019-01-05: 5 mg via INTRAVENOUS

## 2019-01-05 SURGICAL SUPPLY — 99 items
ADH SKN CLS APL DERMABOND .7 (GAUZE/BANDAGES/DRESSINGS)
AID PSTN UNV HD RSTRNT DISP (MISCELLANEOUS) ×1
APL PRP STRL LF DISP 70% ISPRP (MISCELLANEOUS) ×1
APL SKNCLS STERI-STRIP NONHPOA (GAUZE/BANDAGES/DRESSINGS)
BENZOIN TINCTURE PRP APPL 2/3 (GAUZE/BANDAGES/DRESSINGS) IMPLANT
BLADE CLIPPER SURG (BLADE) IMPLANT
BLADE SURG 15 STRL LF DISP TIS (BLADE) IMPLANT
BLADE SURG 15 STRL SS (BLADE)
BURR OVAL 8 FLU 4.0MM X 13CM (MISCELLANEOUS) ×1
BURR OVAL 8 FLU 4.0X13 (MISCELLANEOUS) ×2 IMPLANT
CANNULA 5.75X7 CRYSTAL CLEAR (CANNULA) ×3 IMPLANT
CANNULA 5.75X71 LONG (CANNULA) ×1 IMPLANT
CANNULA TWIST IN 8.25X7CM (CANNULA) IMPLANT
CHLORAPREP W/TINT 26 (MISCELLANEOUS) ×3 IMPLANT
CLOSURE WOUND 1/2 X4 (GAUZE/BANDAGES/DRESSINGS)
COVER WAND RF STERILE (DRAPES) IMPLANT
CUTTER BONE 4.0MM X 13CM (MISCELLANEOUS) ×3 IMPLANT
DECANTER SPIKE VIAL GLASS SM (MISCELLANEOUS) IMPLANT
DERMABOND ADVANCED (GAUZE/BANDAGES/DRESSINGS)
DERMABOND ADVANCED .7 DNX12 (GAUZE/BANDAGES/DRESSINGS) IMPLANT
DRAPE HALF SHEET 70X43 (DRAPES) IMPLANT
DRAPE IMP U-DRAPE 54X76 (DRAPES) ×3 IMPLANT
DRAPE INCISE IOBAN 66X45 STRL (DRAPES) ×3 IMPLANT
DRAPE STERI 35X30 U-POUCH (DRAPES) ×3 IMPLANT
DRAPE SURG 17X23 STRL (DRAPES) ×3 IMPLANT
DRAPE U-SHAPE 47X51 STRL (DRAPES) ×3 IMPLANT
DRAPE U-SHAPE 76X120 STRL (DRAPES) ×6 IMPLANT
DRSG OPSITE 6X11 MED (GAUZE/BANDAGES/DRESSINGS) ×1 IMPLANT
DRSG PAD ABDOMINAL 8X10 ST (GAUZE/BANDAGES/DRESSINGS) ×3 IMPLANT
ELECT BLADE 4.0 EZ CLEAN MEGAD (MISCELLANEOUS)
ELECT REM PT RETURN 9FT ADLT (ELECTROSURGICAL) ×3
ELECTRODE BLDE 4.0 EZ CLN MEGD (MISCELLANEOUS) IMPLANT
ELECTRODE REM PT RTRN 9FT ADLT (ELECTROSURGICAL) ×1 IMPLANT
GAUZE 4X4 16PLY RFD (DISPOSABLE) IMPLANT
GAUZE SPONGE 4X4 12PLY STRL (GAUZE/BANDAGES/DRESSINGS) ×3 IMPLANT
GAUZE XEROFORM 1X8 LF (GAUZE/BANDAGES/DRESSINGS) ×3 IMPLANT
GLOVE BIO SURGEON STRL SZ 6.5 (GLOVE) ×1 IMPLANT
GLOVE BIO SURGEON STRL SZ7 (GLOVE) ×3 IMPLANT
GLOVE BIO SURGEON STRL SZ7.5 (GLOVE) ×3 IMPLANT
GLOVE BIO SURGEONS STRL SZ 6.5 (GLOVE) ×1
GLOVE BIOGEL PI IND STRL 7.0 (GLOVE) ×1 IMPLANT
GLOVE BIOGEL PI IND STRL 8 (GLOVE) ×1 IMPLANT
GLOVE BIOGEL PI INDICATOR 7.0 (GLOVE) ×6
GLOVE BIOGEL PI INDICATOR 8 (GLOVE) ×2
GLOVE ECLIPSE 6.5 STRL STRAW (GLOVE) ×2 IMPLANT
GOWN STRL REUS W/ TWL LRG LVL3 (GOWN DISPOSABLE) ×2 IMPLANT
GOWN STRL REUS W/TWL LRG LVL3 (GOWN DISPOSABLE) ×9
GOWN STRL REUS W/TWL XL LVL3 (GOWN DISPOSABLE) ×3 IMPLANT
LASSO CRESCENT QUICKPASS (SUTURE) IMPLANT
MANIFOLD NEPTUNE II (INSTRUMENTS) ×3 IMPLANT
NDL 1/2 CIR CATGUT .05X1.09 (NEEDLE) IMPLANT
NDL SCORPION MULTI FIRE (NEEDLE) IMPLANT
NDL SUT 6 .5 CRC .975X.05 MAYO (NEEDLE) IMPLANT
NEEDLE 1/2 CIR CATGUT .05X1.09 (NEEDLE) IMPLANT
NEEDLE MAYO TAPER (NEEDLE)
NEEDLE SCORPION MULTI FIRE (NEEDLE) IMPLANT
NS IRRIG 1000ML POUR BTL (IV SOLUTION) IMPLANT
PACK ARTHROSCOPY DSU (CUSTOM PROCEDURE TRAY) ×3 IMPLANT
PACK BASIN DAY SURGERY FS (CUSTOM PROCEDURE TRAY) ×3 IMPLANT
PORT APPOLLO RF 90DEGREE MULTI (SURGICAL WAND) ×1 IMPLANT
PROBE BIPOLAR ATHRO 135MM 90D (MISCELLANEOUS) ×3 IMPLANT
RESTRAINT HEAD UNIVERSAL NS (MISCELLANEOUS) ×3 IMPLANT
SLEEVE SCD COMPRESS KNEE MED (MISCELLANEOUS) ×3 IMPLANT
SLING ARM FOAM STRAP LRG (SOFTGOODS) ×2 IMPLANT
SPONGE LAP 4X18 RFD (DISPOSABLE) IMPLANT
STRIP CLOSURE SKIN 1/2X4 (GAUZE/BANDAGES/DRESSINGS) IMPLANT
SUCTION FRAZIER HANDLE 10FR (MISCELLANEOUS)
SUCTION TUBE FRAZIER 10FR DISP (MISCELLANEOUS) IMPLANT
SUPPORT WRAP ARM LG (MISCELLANEOUS) IMPLANT
SUT BONE WAX W31G (SUTURE) IMPLANT
SUT ETHIBOND 2 OS 4 DA (SUTURE) IMPLANT
SUT ETHILON 3 0 PS 1 (SUTURE) ×3 IMPLANT
SUT ETHILON 4 0 PS 2 18 (SUTURE) IMPLANT
SUT FIBERWIRE #2 38 T-5 BLUE (SUTURE)
SUT FIBERWIRE 2-0 18 17.9 3/8 (SUTURE)
SUT MNCRL AB 3-0 PS2 18 (SUTURE) IMPLANT
SUT MNCRL AB 4-0 PS2 18 (SUTURE) IMPLANT
SUT PDS AB 0 CT 36 (SUTURE) IMPLANT
SUT PROLENE 3 0 PS 2 (SUTURE) IMPLANT
SUT TIGER TAPE 7 IN WHITE (SUTURE) IMPLANT
SUT VIC AB 0 CT1 27 (SUTURE)
SUT VIC AB 0 CT1 27XBRD ANBCTR (SUTURE) IMPLANT
SUT VIC AB 2-0 SH 27 (SUTURE)
SUT VIC AB 2-0 SH 27XBRD (SUTURE) IMPLANT
SUT VIC AB 3-0 SH 27 (SUTURE)
SUT VIC AB 3-0 SH 27X BRD (SUTURE) IMPLANT
SUTURE FIBERWR #2 38 T-5 BLUE (SUTURE) IMPLANT
SUTURE FIBERWR 2-0 18 17.9 3/8 (SUTURE) IMPLANT
SUTURE TAPE 1.3 40 TPR END (SUTURE) IMPLANT
SUTURE TAPE 1.3 FIBERLOP 20 ST (SUTURE) IMPLANT
SUTURETAPE 1.3 40 TPR END (SUTURE)
SUTURETAPE 1.3 FIBERLOOP 20 ST (SUTURE)
SYR BULB 3OZ (MISCELLANEOUS) IMPLANT
TAPE FIBER 2MM 7IN #2 BLUE (SUTURE) ×3 IMPLANT
TOWEL GREEN STERILE FF (TOWEL DISPOSABLE) ×3 IMPLANT
TUBE CONNECTING 20'X1/4 (TUBING) ×1
TUBE CONNECTING 20X1/4 (TUBING) ×2 IMPLANT
TUBING ARTHROSCOPY IRRIG 16FT (MISCELLANEOUS) ×3 IMPLANT
WATER STERILE IRR 1000ML POUR (IV SOLUTION) ×3 IMPLANT

## 2019-01-05 NOTE — Transfer of Care (Signed)
Immediate Anesthesia Transfer of Care Note  Patient: Travis Arnold  Procedure(s) Performed: ARTHROSCOPY SHOULDER DEBRIDEMENT CHONDROMALACIA  AND BICEPS TENOTOMY (Right Shoulder)  Patient Location: PACU  Anesthesia Type:GA combined with regional for post-op pain  Level of Consciousness: awake, alert , oriented and patient cooperative  Airway & Oxygen Therapy: Patient Spontanous Breathing and Patient connected to face mask oxygen  Post-op Assessment: Report given to RN and Post -op Vital signs reviewed and stable  Post vital signs: Reviewed and stable  Last Vitals:  Vitals Value Taken Time  BP 118/73 01/05/19 1334  Temp    Pulse 63 01/05/19 1336  Resp 18 01/05/19 1336  SpO2 97 % 01/05/19 1336  Vitals shown include unvalidated device data.  Last Pain:  Vitals:   01/05/19 1112  TempSrc:   PainSc: 5       Patients Stated Pain Goal: 3 ([id number])  Complications: No apparent anesthesia complications

## 2019-01-05 NOTE — Anesthesia Procedure Notes (Signed)
Procedure Name: Intubation Date/Time: 01/05/2019 12:51 PM Performed by: Raenette Rover, CRNA Pre-anesthesia Checklist: Patient identified, Emergency Drugs available, Suction available and Patient being monitored Patient Re-evaluated:Patient Re-evaluated prior to induction Oxygen Delivery Method: Circle system utilized Preoxygenation: Pre-oxygenation with 100% oxygen Induction Type: IV induction Ventilation: Mask ventilation without difficulty Laryngoscope Size: Miller and 2 Grade View: Grade I Tube type: Oral Tube size: 7.0 mm Number of attempts: 1 Airway Equipment and Method: Stylet Placement Confirmation: ETT inserted through vocal cords under direct vision,  positive ETCO2 and breath sounds checked- equal and bilateral Secured at: 23 cm Tube secured with: Tape Dental Injury: Teeth and Oropharynx as per pre-operative assessment

## 2019-01-05 NOTE — Anesthesia Preprocedure Evaluation (Addendum)
Anesthesia Evaluation   Patient awake and Patient confused    Reviewed: Allergy & Precautions, NPO status , Patient's Chart, lab work & pertinent test results  Airway Mallampati: II  TM Distance: >3 FB Neck ROM: Limited    Dental no notable dental hx. (+) Teeth Intact   Pulmonary sleep apnea ,  Non compliant with CPAP   Pulmonary exam normal breath sounds clear to auscultation       Cardiovascular hypertension, Pt. on medications Normal cardiovascular exam Rhythm:Regular Rate:Normal     Neuro/Psych  Headaches, HOH bilateral  Neuromuscular disease negative psych ROS   GI/Hepatic Neg liver ROS, GERD  Medicated and Controlled,  Endo/Other  Hyperlipidemia  Renal/GU negative Renal ROS  negative genitourinary   Musculoskeletal  (+) Arthritis , Osteoarthritis,  Chondromalacia right shoulder   Abdominal   Peds  Hematology negative hematology ROS (+)   Anesthesia Other Findings   Reproductive/Obstetrics                            Anesthesia Physical Anesthesia Plan  ASA: II  Anesthesia Plan: General   Post-op Pain Management:  Regional for Post-op pain   Induction: Intravenous  PONV Risk Score and Plan: 3 and Ondansetron, Treatment may vary due to age or medical condition and Dexamethasone  Airway Management Planned: Oral ETT  Additional Equipment:   Intra-op Plan:   Post-operative Plan: Extubation in OR  Informed Consent: I have reviewed the patients History and Physical, chart, labs and discussed the procedure including the risks, benefits and alternatives for the proposed anesthesia with the patient or authorized representative who has indicated his/her understanding and acceptance.       Plan Discussed with: CRNA and Surgeon  Anesthesia Plan Comments:         Anesthesia Quick Evaluation

## 2019-01-05 NOTE — Anesthesia Postprocedure Evaluation (Signed)
Anesthesia Post Note  Patient: Travis Arnold  Procedure(s) Performed: ARTHROSCOPY SHOULDER DEBRIDEMENT CHONDROMALACIA  AND BICEPS TENOTOMY (Right Shoulder)     Patient location during evaluation: PACU Anesthesia Type: General Level of consciousness: awake and alert and oriented Pain management: pain level controlled Vital Signs Assessment: post-procedure vital signs reviewed and stable Respiratory status: spontaneous breathing, nonlabored ventilation and respiratory function stable Cardiovascular status: blood pressure returned to baseline and stable Postop Assessment: no apparent nausea or vomiting Anesthetic complications: no    Last Vitals:  Vitals:   01/05/19 1345 01/05/19 1400  BP: 118/74 112/71  Pulse: 62 (!) 56  Resp: 16 15  Temp:    SpO2: 97% 97%    Last Pain:  Vitals:   01/05/19 1112  TempSrc:   PainSc: 5                  Kemyah Buser A.

## 2019-01-05 NOTE — Progress Notes (Signed)
Assisted Dr. Royce Macadamia with right, ultrasound guided, interscalene  block. Side rails up, monitors on throughout procedure. See vital signs in flow sheet. Tolerated Procedure well.

## 2019-01-05 NOTE — Anesthesia Procedure Notes (Addendum)
Anesthesia Regional Block: Interscalene brachial plexus block   Pre-Anesthetic Checklist: ,, timeout performed, Correct Patient, Correct Site, Correct Laterality, Correct Procedure, Correct Position, site marked, Risks and benefits discussed,  Surgical consent,  Pre-op evaluation,  At surgeon's request and post-op pain management  Laterality: Right  Prep: chloraprep       Needles:  Injection technique: Single-shot  Needle Type: Echogenic Stimulator Needle     Needle Length: 9cm  Needle Gauge: 21   Needle insertion depth: 7 cm   Additional Needles:   Procedures:, nerve stimulator,,, ultrasound used (permanent image in chart),,,,  Motor weakness within 8 minutes.  Narrative:  Start time: 01/05/2019 12:25 PM End time: 01/05/2019 12:30 PM Injection made incrementally with aspirations every 5 mL.  Performed by: Personally   Additional Notes: Timeout performed. Patient sedated. Relevant anatomy ID'd using [place]. Incremental 2-34ml injection of LA with frequent aspiration. Patient tolerated procedure well.        Right Interscalene Block

## 2019-01-05 NOTE — H&P (Signed)
Travis Arnold is an 68 y.o. male.   Chief Complaint: R shoulder pain HPI: H/o R shoulder pain with symptomatic biceps tendinopathy and chondromalacia, failed conservative treatment.  Past Medical History:  Diagnosis Date  . Arthritis   . Arthritis of right shoulder region   . Bronchitis    hx  . Cancer ([place])    skin melanoma back  . Deaf   . GERD (gastroesophageal reflux disease)   . Headache(784.0)   . History of stress test 2000   many yrs. ago due to family history of several cardiac deaths in family prior to age 47   . Hypertension   . Sleep apnea 2006   does not use cpap-pcp says not needed-mild  . Sleep disturbance    given CPAP machine, but needs new study, doesn't use CPAP currently   . Wears hearing aid    both ears, but mostly deaf    Past Surgical History:  Procedure Laterality Date  . CATARACT EXTRACTION Left 11  . CERVICAL DISC SURGERY  98.11.   x2  . EYE SURGERY Left    detached retina  . HERNIA REPAIR Bilateral 73,77,98   several both sides  . POSTERIOR CERVICAL FUSION/FORAMINOTOMY N/A 02/18/2013   Procedure: POSTERIOR CERVICAL FUSION/C3 - C4, C4 - C5, C5 - C6, C6 - C7, C7 - T1;  Surgeon: Sinclair Ship, MD;  Location: [facility];  Service: Orthopedics;  Laterality: N/A;  Posterior cervical fusion, cervical 3-4, cervical 4-5, cervical 5-6, cervical 6-7, cervical 7-thoracic 1 with instrumentation, allograft.  Marland Kitchen SHOULDER ARTHROSCOPY Bilateral 09   spurs  . SHOULDER ARTHROSCOPY Right 10   spurs  . SHOULDER ARTHROSCOPY WITH SUBACROMIAL DECOMPRESSION Left 02/22/2014   Procedure: LEFT SHOULDER ARTHROSCOPY WITH DEBRIDEMENT, CHONDROMALACIA & subacromial decompression ;  Surgeon: Nita Sells, MD;  Location: [facility];  Service: Orthopedics;  Laterality: Left;  . TONSILLECTOMY      History reviewed. No pertinent family history. Social History:  reports that he has never smoked. He has never used smokeless tobacco. He reports current  alcohol use of about 21.0 standard drinks of alcohol per week. He reports that he does not use drugs.  Allergies: No Known Allergies  Medications Prior to Admission  Medication Sig Dispense Refill  . aspirin 81 MG tablet Take 81 mg by mouth daily.    Marland Kitchen atorvastatin (LIPITOR) 20 MG tablet Take 20 mg by mouth every evening.    . carisoprodol (SOMA) 350 MG tablet Take 350 mg by mouth 4 (four) times daily as needed for muscle spasms.    Marland Kitchen esomeprazole (NEXIUM) 40 MG capsule Take 40 mg by mouth See admin instructions. Takes 1 tablet every morning, but can take another tablet during the day if needed for acid reflux.    . finasteride (PROSCAR) 5 MG tablet Take 5 mg by mouth every morning.    Marland Kitchen lisinopril-hydrochlorothiazide (PRINZIDE,ZESTORETIC) 10-12.5 MG per tablet Take 1 tablet by mouth every morning.     . meclizine (ANTIVERT) 50 MG tablet Take 25 mg by mouth 3 (three) times daily as needed for dizziness.     . meloxicam (MOBIC) 15 MG tablet Take 15 mg by mouth daily.    . metoprolol (LOPRESSOR) 50 MG tablet Take 50 mg by mouth every morning.    Marland Kitchen oxyCODONE-acetaminophen (ROXICET) 5-325 MG per tablet Take 1-2 tablets by mouth every 4 (four) hours as needed for severe pain. 40 tablet 0  . traMADol (ULTRAM) 50 MG tablet Take by  mouth every 6 (six) hours as needed.    . docusate sodium (COLACE) 100 MG capsule Take 1 capsule (100 mg total) by mouth 3 (three) times daily as needed. 20 capsule 0  . doxazosin (CARDURA) 4 MG tablet Take 4 mg by mouth every evening.    . loperamide (IMODIUM A-D) 2 MG tablet Take 4 mg by mouth 4 (four) times daily as needed for diarrhea or loose stools.      No results found for this or any previous visit (from the past 48 hour(s)). No results found.  Review of Systems  All other systems reviewed and are negative.   Blood pressure 123/85, pulse (!) 50, temperature (!) 95.9 F (35.5 C), temperature source Tympanic, resp. rate 12, height 6' (1.829 m), weight 81 kg,  SpO2 97 %. Physical Exam  Constitutional: He is oriented to person, place, and time. He appears well-developed and well-nourished.  HENT:  Head: Atraumatic.  Eyes: EOM are normal.  Cardiovascular: Intact distal pulses.  Respiratory: Effort normal.  Musculoskeletal:     Comments: R shoulder pain with biceps testing.  Neurological: He is alert and oriented to person, place, and time.  Skin: Skin is warm and dry.  Psychiatric: He has a normal mood and affect.     Assessment/Plan H/o R shoulder pain with symptomatic biceps tendinopathy and chondromalacia, failed conservative treatment. Plan R shoulder arthr debridement, possible tenotomy. Risks / benefits of surgery discussed Consent on chart  NPO for OR Preop antibiotics   Isabella Stalling, MD 01/05/2019, 12:30 PM

## 2019-01-05 NOTE — Op Note (Signed)
Procedure(s): ARTHROSCOPY SHOULDER DEBRIDEMENT CHONDROMALACIA  AND BICEPS TENOTOMY Procedure Note  Travis Arnold male 68 y.o. 01/05/2019  Preoperative diagnosis: #1 right shoulder chondromalacia #2 right shoulder proximal long head biceps tendinopathy  Postoperative diagnosis: Same with significant superior labral tearing  Procedure(s) and Anesthesia Type: #1 right shoulder arthroscopic debridement grade III and IV chondromalacia and superior labral tear #2 right shoulder proximal long head biceps tenotomy #3 right shoulder intra-articular corticosteroid injection  Surgeon(s) and Role:    Tania Ade, MD - Primary     Surgeon: Isabella Stalling   Assistants: Jeanmarie Hubert PA-C (Danielle was present and scrubbed throughout the procedure and was essential in positioning, assisting with the camera and instrumentation,, and closure)  Anesthesia: General endotracheal anesthesia with preoperative interscalene block given by the attending anesthesiologist    Procedure Detail  ARTHROSCOPY SHOULDER DEBRIDEMENT CHONDROMALACIA  AND BICEPS TENOTOMY  Estimated Blood Loss: Min         Drains: none  Blood Given: none         Specimens: none        Complications:  * No complications entered in OR log *         Disposition: PACU - hemodynamically stable.         Condition: stable    Procedure:   INDICATIONS FOR SURGERY: The patient is 68 y.o. male who has had a long history of right shoulder pain and problems made worse recently.  He was found to have significant chondromalacia and proximal biceps tendinopathy.  He failed conservative management.  Indicated for surgical treatment to decrease pain and improve function.  OPERATIVE FINDINGS: Examination under anesthesia: No stiffness or instability   DESCRIPTION OF PROCEDURE: The patient was identified in preoperative  holding area where I personally marked the operative site after  verifying site, side, and  procedure with the patient. An interscalene block was given by the attending anesthesiologist the holding area.  The patient was taken back to the operating room where general anesthesia was induced without complication and was placed in the beach-chair position with the back  elevated about 60 degrees and all extremities and head and neck carefully padded and  positioned.   The right upper extremity was then prepped and  draped in a standard sterile fashion. The appropriate time-out  procedure was carried out. The patient did receive IV antibiotics  within 30 minutes of incision.   A small posterior portal incision was made and the arthroscope was introduced into the joint. An anterior portal was then established above the subscapularis using needle localization. Small cannula was placed anteriorly. Diagnostic arthroscopy was then carried out. Subscapularis was noted to be completely intact.  He had significant partial tearing of the superior labrum involving the origin of the biceps.  The biceps tendon was pulled into the joint noted to have severe tenosynovitis.  This was felt to be a significant pain generator.  Therefore through a small anterior incision Mayo scissors were used to release the long head biceps from the superior labrum and it was allowed to retract from the joint. The glenohumeral joint surfaces were noted to have significant chondromalacia with grade 4 changes over about 60 to 70% of the humeral head centrally.  This was debrided back to stable shoulders.  The glenoid cartilage was largely intact with the anterior-inferior quadrant had significant detachment with a large flap of cartilage which was debrided back to stable shoulders using the shaver. The undersurface of the supraspinatus was noted  to be completely intact as was the infraspinatus.  At this point through the anterior portal a corticosteroid injection was given with 1 cc 40 mg Kenalog with 5 cc of Marcaine  plain.  The arthroscopic equipment was removed from the joint and the portals were closed with 3-0 nylon in an interrupted fashion. Sterile dressings were then applied including Xeroform 4 x 4's ABDs and tape. The patient was then allowed to awaken from general anesthesia, placed in a sling, transferred to the stretcher and taken to the recovery room in stable condition.   POSTOPERATIVE PLAN: The patient will be discharged home today and will followup in one week for suture removal and wound check.

## 2019-01-05 NOTE — Discharge Instructions (Signed)
Discharge Instructions after Arthroscopic Shoulder Surgery   A sling has been provided for you. You may remove the sling after 72 hours. The sling may be worn for your protection, if you are in a crowd.  Use ice on the shoulder intermittently over the first 48 hours after surgery.  Pain medication has been prescribed for you.  Use your medication liberally over the first 48 hours, and then begin to taper your use. You may take Extra Strength Tylenol or Tylenol only in place of the pain pills. DO NOT take ANY nonsteroidal anti-inflammatory pain medications: Advil, Motrin, Ibuprofen, Aleve, Naproxen, or Naprosyn.  You may remove your dressing after two days.  You may shower 5 days after surgery. The incision CANNOT get wet prior to 5 days. Simply allow the water to wash over the site and then pat dry. Do not rub the incision. Make sure your axilla (armpit) is completely dry after showering.  Take one aspirin a day for 2 weeks after surgery, unless you have an aspirin sensitivity/allergy or asthma.  Three to 5 times each day you should perform assisted overhead reaching and external rotation (outward turning) exercises with the operative arm. Both exercises should be done with the non-operative arm used as the "therapist arm" while the operative arm remains relaxed. Ten of each exercise should be done three to five times each day.    Overhead reach is helping to lift your stiff arm up as high as it will go. To stretch your overhead reach, lie flat on your back, relax, and grasp the wrist of the tight shoulder with your opposite hand. Using the power in your opposite arm, bring the stiff arm up as far as it is comfortable. Start holding it for ten seconds and then work up to where you can hold it for a count of 30. Breathe slowly and deeply while the arm is moved. Repeat this stretch ten times, trying to help the arm up a little higher each time.       External rotation is turning the arm out to  the side while your elbow stays close to your body. External rotation is best stretched while you are lying on your back. Hold a cane, yardstick, broom handle, or dowel in both hands. Bend both elbows to a right angle. Use steady, gentle force from your normal arm to rotate the hand of the stiff shoulder out away from your body. Continue the rotation as far as it will go comfortably, holding it there for a count of 10. Repeat this exercise ten times.     Please call [phone] during normal business hours or [phone] after hours for any problems. Including the following:  - excessive redness of the incisions - drainage for more than 4 days - fever of more than 101.5 F  *Please note that pain medications will not be refilled after hours or on weekends.    Post Anesthesia Home Care Instructions  Activity: Get plenty of rest for the remainder of the day. A responsible individual must stay with you for 24 hours following the procedure.  For the next 24 hours, DO NOT: -Drive a car -Operate machinery -Drink alcoholic beverages -Take any medication unless instructed by your physician -Make any legal decisions or sign important papers.  Meals: Start with liquid foods such as gelatin or soup. Progress to regular foods as tolerated. Avoid greasy, spicy, heavy foods. If nausea and/or vomiting occur, drink only clear liquids until the nausea and/or vomiting subsides. Call   physician if vomiting continues.  Special Instructions/Symptoms: Your throat may feel dry or sore from the anesthesia or the breathing tube placed in your throat during surgery. If this causes discomfort, gargle with warm salt water. The discomfort should disappear within 24 hours.  If you had a scopolamine patch placed behind your ear for the management of post- operative nausea and/or vomiting:  1. The medication in the patch is effective for 72 hours, after which it should be removed.  Wrap patch in a tissue and  discard in the trash. Wash hands thoroughly with soap and water. 2. You may remove the patch earlier than 72 hours if you experience unpleasant side effects which may include dry mouth, dizziness or visual disturbances. 3. Avoid touching the patch. Wash your hands with soap and water after contact with the patch.    Regional Anesthesia Blocks  1. Numbness or the inability to move the "blocked" extremity may last from 3-48 hours after placement. The length of time depends on the medication injected and your individual response to the medication. If the numbness is not going away after 48 hours, call your surgeon.  2. The extremity that is blocked will need to be protected until the numbness is gone and the  Strength has returned. Because you cannot feel it, you will need to take extra care to avoid injury. Because it may be weak, you may have difficulty moving it or using it. You may not know what position it is in without looking at it while the block is in effect.  3. For blocks in the legs and feet, returning to weight bearing and walking needs to be done carefully. You will need to wait until the numbness is entirely gone and the strength has returned. You should be able to move your leg and foot normally before you try and bear weight or walk. You will need someone to be with you when you first try to ensure you do not fall and possibly risk injury.  4. Bruising and tenderness at the needle site are common side effects and will resolve in a few days.  5. Persistent numbness or new problems with movement should be communicated to the surgeon or the [facility] ([phone])/ [facility] ([phone]).Information for Discharge Teaching: EXPAREL (bupivacaine liposome injectable suspension)   Your surgeon or anesthesiologist gave you EXPAREL(bupivacaine) to help control your pain after surgery.   EXPAREL is a local anesthetic that provides pain relief by numbing the  tissue around the surgical site.  EXPAREL is designed to release pain medication over time and can control pain for up to 72 hours.  Depending on how you respond to EXPAREL, you may require less pain medication during your recovery.  Possible side effects:  Temporary loss of sensation or ability to move in the area where bupivacaine was injected.  Nausea, vomiting, constipation  Rarely, numbness and tingling in your mouth or lips, lightheadedness, or anxiety may occur.  Call your doctor right away if you think you may be experiencing any of these sensations, or if you have other questions regarding possible side effects.  Follow all other discharge instructions given to you by your surgeon or nurse. Eat a healthy diet and drink plenty of water or other fluids.  If you return to the hospital for any reason within 96 hours following the administration of EXPAREL, it is important for health care providers to know that you have received this anesthetic. A teal colored band has   been placed on your arm with the date, time and amount of EXPAREL you have received in order to alert and inform your health care providers. Please leave this armband in place for the full 96 hours following administration, and then you may remove the band.    

## 2019-01-07 ENCOUNTER — Encounter (HOSPITAL_BASED_OUTPATIENT_CLINIC_OR_DEPARTMENT_OTHER): Payer: Self-pay | Admitting: Orthopedic Surgery

## 2019-04-01 ENCOUNTER — Other Ambulatory Visit: Payer: Self-pay | Admitting: Orthopedic Surgery

## 2019-04-08 NOTE — Patient Instructions (Addendum)
DUE TO COVID-19 ONLY ONE VISITOR IS ALLOWED TO COME WITH YOU AND STAY IN THE WAITING ROOM ONLY DURING PRE OP AND PROCEDURE DAY OF SURGERY. THE 1 VISITOR MAY VISIT WITH YOU AFTER SURGERY IN YOUR PRIVATE ROOM DURING VISITING HOURS ONLY!  YOU NEED TO HAVE A COVID 19 TEST ON: 04/13/19 @  10:00 am      , THIS TEST MUST BE DONE BEFORE SURGERY, COME  [place], [place] , [id number].  ([place]) ONCE YOUR COVID TEST IS COMPLETED, PLEASE BEGIN THE QUARANTINE INSTRUCTIONS AS OUTLINED IN YOUR HANDOUT.                Travis Arnold     Your procedure is scheduled on: 04/16/19   Report to [facility] Main  Entrance   Report to SHORT STAY at: 5:30 AM     Call this number if you have problems the morning of surgery [phone]    Remember:   [place], NO CHEWING GUM [place].     Take these medicines the morning of surgery with A SIP OF WATER: DOXAZOSIN,ESOMEPRAZOLE,FINASTERIDE,METOPROLOL.MECLIZINE AS   NEEDED.                                You may not have any metal on your body including hair pins and              piercings  Do not wear jewelry,lotions, powders or perfumes, deodorant             Men may shave face and neck.   Do not bring valuables to the hospital. [place].  Contacts, dentures or bridgework may not be worn into surgery.  Leave suitcase in the car. After surgery it may be brought to your room.     Patients discharged the day of surgery will not be allowed to drive home. IF YOU ARE HAVING SURGERY AND GOING HOME THE SAME DAY, YOU MUST HAVE AN ADULT TO DRIVE YOU HOME AND BE WITH YOU FOR 24 HOURS. YOU MAY GO HOME BY TAXI OR UBER OR ORTHERWISE, BUT AN ADULT MUST ACCOMPANY YOU HOME AND STAY WITH YOU FOR 24 HOURS.  Name and phone number of your driver:  Special Instructions: N/A              Please read over the following fact sheets you  were given: _____________________________________________________________________             NO SOLID FOOD AFTER MIDNIGHT THE NIGHT PRIOR TO SURGERY. NOTHING BY MOUTH EXCEPT CLEAR LIQUIDS UNTIL: 4:30 AM . PLEASE FINISH ENSURE DRINK PER SURGEON ORDER  WHICH NEEDS TO BE COMPLETED AT : 4:30 AM.   CLEAR LIQUID DIET   Foods Allowed                                                                     Foods Excluded  Coffee and tea, regular and decaf  liquids that you cannot  Plain Jell-O any favor except red or purple                                           see through such as: Fruit ices (not with fruit pulp)                                     milk, soups, orange juice  Iced Popsicles                                    All solid food Carbonated beverages, regular and diet                                    Cranberry, grape and apple juices Sports drinks like Gatorade Lightly seasoned clear broth or consume(fat free) Sugar, honey syrup  Sample Menu Breakfast                                Lunch                                     Supper Cranberry juice                    Beef broth                            Chicken broth Jell-O                                     Grape juice                           Apple juice Coffee or tea                        Jell-O                                      Popsicle                                                Coffee or tea                        Coffee or tea  _____________________________________________________________________  [facility]- Preparing for Total Shoulder Arthroplasty    Before surgery, you can play an important role. Because skin is not sterile, your skin needs to be as free of germs as possible. You can reduce the number of germs on your skin by using the following products. . Benzoyl Peroxide Gel o Reduces the number of germs  present on the skin o Applied twice a day to shoulder area starting two  days before surgery    ==================================================================  Please follow these instructions carefully:  BENZOYL PEROXIDE 5% GEL  Please do not use if you have an allergy to benzoyl peroxide.   If your skin becomes reddened/irritated stop using the benzoyl peroxide.  Starting two days before surgery, apply as follows: 1. Apply benzoyl peroxide in the morning and at night. Apply after taking a shower. If you are not taking a shower clean entire shoulder front, back, and side along with the armpit with a clean wet washcloth.  2. Place a quarter-sized dollop on your shoulder and rub in thoroughly, making sure to cover the front, back, and side of your shoulder, along with the armpit.   2 days before ____ AM   ____ PM              1 day before ____ AM   ____ PM                         3. Do this twice a day for two days.  (Last application is the night before surgery, AFTER using the CHG soap as described below).  4. Do NOT apply benzoyl peroxide gel on the day of surgery.        - Preparing for Surgery Before surgery, you can play an important role.  Because skin is not sterile, your skin needs to be as free of germs as possible.  You can reduce the number of germs on your skin by washing with CHG (chlorahexidine gluconate) soap before surgery.  CHG is an antiseptic cleaner which kills germs and bonds with the skin to continue killing germs even after washing. Please DO NOT use if you have an allergy to CHG or antibacterial soaps.  If your skin becomes reddened/irritated stop using the CHG and inform your nurse when you arrive at Short Stay. Do not shave (including legs and underarms) for at least 48 hours prior to the first CHG shower.  You may shave your face/neck. Please follow these instructions carefully:  1.  Shower with CHG Soap the night before surgery and the  morning of Surgery.  2.  If you choose to wash your hair, wash your hair first  as usual with your  normal  shampoo.  3.  After you shampoo, rinse your hair and body thoroughly to remove the  shampoo.                           4.  Use CHG as you would any other liquid soap.  You can apply chg directly  to the skin and wash                       Gently with a scrungie or clean washcloth.  5.  Apply the CHG Soap to your body ONLY FROM THE NECK DOWN.   Do not use on face/ open                           Wound or open sores. Avoid contact with eyes, ears mouth and genitals (private parts).                       Wash face,  Genitals (private parts) with your normal soap.  6.  Wash thoroughly, paying special attention to the area where your surgery  will be performed.  7.  Thoroughly rinse your body with warm water from the neck down.  8.  DO NOT shower/wash with your normal soap after using and rinsing off  the CHG Soap.                9.  Pat yourself dry with a clean towel.            10.  Wear clean pajamas.            11.  Place clean sheets on your bed the night of your first shower and do not  sleep with pets. Day of Surgery : Do not apply any lotions/deodorants the morning of surgery.  Please wear clean clothes to the hospital/surgery center.  FAILURE TO FOLLOW THESE INSTRUCTIONS MAY RESULT IN THE CANCELLATION OF YOUR SURGERY PATIENT SIGNATURE_________________________________  NURSE SIGNATURE__________________________________  ________________________________________________________________________   Adam Phenix  An incentive spirometer is a tool that can help keep your lungs clear and active. This tool measures how well you are filling your lungs with each breath. Taking long deep breaths may help reverse or decrease the chance of developing breathing (pulmonary) problems (especially infection) following:  A long period of time when you are unable to move or be active. BEFORE THE PROCEDURE   If the spirometer includes an indicator to show your  best effort, your nurse or respiratory therapist will set it to a desired goal.  If possible, sit up straight or lean slightly forward. Try not to slouch.  Hold the incentive spirometer in an upright position. INSTRUCTIONS FOR USE  1. Sit on the edge of your bed if possible, or sit up as far as you can in bed or on a chair. 2. Hold the incentive spirometer in an upright position. 3. Breathe out normally. 4. Place the mouthpiece in your mouth and seal your lips tightly around it. 5. Breathe in slowly and as deeply as possible, raising the piston or the ball toward the top of the column. 6. Hold your breath for 3-5 seconds or for as long as possible. Allow the piston or ball to fall to the bottom of the column. 7. Remove the mouthpiece from your mouth and breathe out normally. 8. Rest for a few seconds and repeat Steps 1 through 7 at least 10 times every 1-2 hours when you are awake. Take your time and take a few normal breaths between deep breaths. 9. The spirometer may include an indicator to show your best effort. Use the indicator as a goal to work toward during each repetition. 10. After each set of 10 deep breaths, practice coughing to be sure your lungs are clear. If you have an incision (the cut made at the time of surgery), support your incision when coughing by placing a pillow or rolled up towels firmly against it. Once you are able to get out of bed, walk around indoors and cough well. You may stop using the incentive spirometer when instructed by your caregiver.  RISKS AND COMPLICATIONS  Take your time so you do not get dizzy or light-headed.  If you are in pain, you may need to take or ask for pain medication before doing incentive spirometry. It is harder to take a deep breath if you are having pain. AFTER USE  Rest and breathe slowly and easily.  It can be helpful to keep track of a log of your  progress. Your caregiver can provide you with a simple table to help with this. If  you are using the spirometer at home, follow these instructions: [place] IF:   You are having difficultly using the spirometer.  You have trouble using the spirometer as often as instructed.  Your pain medication is not giving enough relief while using the spirometer.  You develop fever of 100.5 F (38.1 C) or higher. SEEK IMMEDIATE MEDICAL CARE IF:   You cough up bloody sputum that had not been present before.  You develop fever of 102 F (38.9 C) or greater.  You develop worsening pain at or near the incision site. MAKE SURE YOU:   Understand these instructions.  Will watch your condition.  Will get help right away if you are not doing well or get worse. Document Released: 06/11/2006 Document Revised: 04/23/2011 Document Reviewed: 08/12/2006 [facility] Patient Information 2014 [place], [place].   ________________________________________________________________________

## 2019-04-09 ENCOUNTER — Encounter (HOSPITAL_COMMUNITY)
Admission: RE | Admit: 2019-04-09 | Discharge: 2019-04-09 | Disposition: A | Discharge status: Home / Self Care | Payer: Medicare PPO | Source: Ambulatory Visit | Attending: Orthopedic Surgery | Admitting: Orthopedic Surgery

## 2019-04-09 ENCOUNTER — Encounter (HOSPITAL_COMMUNITY): Payer: Self-pay

## 2019-04-09 ENCOUNTER — Other Ambulatory Visit: Payer: Self-pay

## 2019-04-09 ENCOUNTER — Ambulatory Visit (HOSPITAL_COMMUNITY)
Admission: RE | Admit: 2019-04-09 | Discharge: 2019-04-09 | Disposition: A | Discharge status: Home / Self Care | Payer: Medicare PPO | Source: Ambulatory Visit | Attending: Orthopedic Surgery | Admitting: Orthopedic Surgery

## 2019-04-09 DIAGNOSIS — Z79899 Other long term (current) drug therapy: Secondary | ICD-10-CM | POA: Insufficient documentation

## 2019-04-09 DIAGNOSIS — Z01812 Encounter for preprocedural laboratory examination: Secondary | ICD-10-CM | POA: Insufficient documentation

## 2019-04-09 DIAGNOSIS — I700 Atherosclerosis of aorta: Secondary | ICD-10-CM | POA: Insufficient documentation

## 2019-04-09 DIAGNOSIS — K219 Gastro-esophageal reflux disease without esophagitis: Secondary | ICD-10-CM | POA: Insufficient documentation

## 2019-04-09 DIAGNOSIS — I10 Essential (primary) hypertension: Secondary | ICD-10-CM | POA: Insufficient documentation

## 2019-04-09 DIAGNOSIS — Z01811 Encounter for preprocedural respiratory examination: Secondary | ICD-10-CM | POA: Insufficient documentation

## 2019-04-09 DIAGNOSIS — M13811 Other specified arthritis, right shoulder: Secondary | ICD-10-CM | POA: Insufficient documentation

## 2019-04-09 DIAGNOSIS — Z7982 Long term (current) use of aspirin: Secondary | ICD-10-CM | POA: Insufficient documentation

## 2019-04-09 DIAGNOSIS — R918 Other nonspecific abnormal finding of lung field: Secondary | ICD-10-CM | POA: Insufficient documentation

## 2019-04-09 DIAGNOSIS — G4730 Sleep apnea, unspecified: Secondary | ICD-10-CM | POA: Insufficient documentation

## 2019-04-09 HISTORY — DX: Pneumonia, unspecified organism: J18.9

## 2019-04-09 LAB — CBC WITH DIFFERENTIAL/PLATELET
Abs Immature Granulocytes: 0.03 10*3/uL (ref 0.00–0.07)
Basophils Absolute: 0 10*3/uL (ref 0.0–0.1)
Basophils Relative: 1 %
Eosinophils Absolute: 0.1 10*3/uL (ref 0.0–0.5)
Eosinophils Relative: 3 %
HCT: 42.2 % (ref 39.0–52.0)
Hemoglobin: 14.5 g/dL (ref 13.0–17.0)
Immature Granulocytes: 1 %
Lymphocytes Relative: 25 %
Lymphs Abs: 1 10*3/uL (ref 0.7–4.0)
MCH: 32.7 pg (ref 26.0–34.0)
MCHC: 34.4 g/dL (ref 30.0–36.0)
MCV: 95 fL (ref 80.0–100.0)
Monocytes Absolute: 0.4 10*3/uL (ref 0.1–1.0)
Monocytes Relative: 10 %
Neutro Abs: 2.5 10*3/uL (ref 1.7–7.7)
Neutrophils Relative %: 60 %
Platelets: 123 10*3/uL — ABNORMAL LOW (ref 150–400)
RBC: 4.44 MIL/uL (ref 4.22–5.81)
RDW: 11.9 % (ref 11.5–15.5)
WBC: 4.1 10*3/uL (ref 4.0–10.5)
nRBC: 0 % (ref 0.0–0.2)

## 2019-04-09 LAB — URINALYSIS, ROUTINE W REFLEX MICROSCOPIC
Bilirubin Urine: NEGATIVE
Glucose, UA: NEGATIVE mg/dL
Hgb urine dipstick: NEGATIVE
Ketones, ur: 5 mg/dL — AB
Nitrite: NEGATIVE
Protein, ur: NEGATIVE mg/dL
Specific Gravity, Urine: 1.02 (ref 1.005–1.030)
pH: 6 (ref 5.0–8.0)

## 2019-04-09 LAB — COMPREHENSIVE METABOLIC PANEL
ALT: 21 U/L (ref 0–44)
AST: 22 U/L (ref 15–41)
Albumin: 3.9 g/dL (ref 3.5–5.0)
Alkaline Phosphatase: 115 U/L (ref 38–126)
Anion gap: 8 (ref 5–15)
BUN: 21 mg/dL (ref 8–23)
CO2: 25 mmol/L (ref 22–32)
Calcium: 9.2 mg/dL (ref 8.9–10.3)
Chloride: 108 mmol/L (ref 98–111)
Creatinine, Ser: 0.8 mg/dL (ref 0.61–1.24)
GFR calc Af Amer: >60 mL/min (ref >60)
GFR calc non Af Amer: >60 mL/min (ref >60)
Glucose, Bld: 105 mg/dL — ABNORMAL HIGH (ref 70–99)
Potassium: 3.9 mmol/L (ref 3.5–5.1)
Sodium: 141 mmol/L (ref 135–145)
Total Bilirubin: 1.1 mg/dL (ref 0.3–1.2)
Total Protein: 6.5 g/dL (ref 6.5–8.1)

## 2019-04-09 LAB — TYPE AND SCREEN
ABO/RH(D): O POS
Antibody Screen: NEGATIVE

## 2019-04-09 LAB — PROTIME-INR
INR: 1 (ref 0.8–1.2)
Prothrombin Time: 13.3 seconds (ref 11.4–15.2)

## 2019-04-09 LAB — ABO/RH: ABO/RH(D): O POS

## 2019-04-09 LAB — SURGICAL PCR SCREEN
MRSA, PCR: NEGATIVE
Staphylococcus aureus: NEGATIVE

## 2019-04-09 LAB — APTT: aPTT: 32 seconds (ref 24–36)

## 2019-04-09 NOTE — Progress Notes (Signed)
PCP - Dr. Laurann Montana Cardiologist -   Chest x-ray -  EKG - 01/01/19. EPIC Stress Test -  ECHO -  Cardiac Cath -   Sleep Study - yes CPAP - no  Fasting Blood Sugar -  Checks Blood Sugar _____ times a day  Blood Thinner Instructions:Pt. Has not received instructions yet.RN suggested to call MD. Office for instructions about Aspirin. Aspirin Instructions: Last Dose:  Anesthesia review:   Patient denies shortness of breath, fever, cough and chest pain at PAT appointment   Patient verbalized understanding of instructions that were given to them at the PAT appointment. Patient was also instructed that they will need to review over the PAT instructions again at home before surgery.

## 2019-04-13 ENCOUNTER — Inpatient Hospital Stay (HOSPITAL_COMMUNITY): Admission: RE | Admit: 2019-04-13 | Payer: Medicare PPO | Source: Ambulatory Visit

## 2019-04-15 NOTE — Anesthesia Preprocedure Evaluation (Addendum)
Anesthesia Evaluation  Patient identified by MRN, date of birth, ID band Patient awake and Patient confused    Reviewed: Allergy & Precautions, NPO status , Patient's Chart, lab work & pertinent test results  History of Anesthesia Complications Negative for: history of anesthetic complications  Airway Mallampati: II  TM Distance: >3 FB Neck ROM: Full    Dental no notable dental hx. (+) Dental Advisory Given   Pulmonary sleep apnea ,  Non compliant with CPAP   Pulmonary exam normal        Cardiovascular hypertension, Pt. on medications and Pt. on home beta blockers Normal cardiovascular exam     Neuro/Psych  Headaches, HOH bilateral  Neuromuscular disease negative psych ROS   GI/Hepatic Neg liver ROS, GERD  Medicated and Controlled,  Endo/Other  Hyperlipidemia  Renal/GU negative Renal ROS  negative genitourinary   Musculoskeletal  (+) Arthritis , Osteoarthritis,  Chondromalacia right shoulder   Abdominal   Peds  Hematology negative hematology ROS (+)   Anesthesia Other Findings   Reproductive/Obstetrics                            Anesthesia Physical  Anesthesia Plan  ASA: II  Anesthesia Plan: General   Post-op Pain Management:  Regional for Post-op pain   Induction: Intravenous  PONV Risk Score and Plan: 3 and Ondansetron, Treatment may vary due to age or medical condition and Dexamethasone  Airway Management Planned: Oral ETT  Additional Equipment:   Intra-op Plan:   Post-operative Plan:   Informed Consent: I have reviewed the patients History and Physical, chart, labs and discussed the procedure including the risks, benefits and alternatives for the proposed anesthesia with the patient or authorized representative who has indicated his/her understanding and acceptance.     Dental advisory given  Plan Discussed with: Surgeon, CRNA and Anesthesiologist  Anesthesia Plan  Comments:        Anesthesia Quick Evaluation

## 2019-04-16 ENCOUNTER — Ambulatory Visit (HOSPITAL_COMMUNITY): Payer: Medicare PPO

## 2019-04-16 ENCOUNTER — Ambulatory Visit (HOSPITAL_COMMUNITY)
Admission: RE | Admit: 2019-04-16 | Discharge: 2019-04-16 | Disposition: A | Discharge status: Home / Self Care | Payer: Medicare PPO | Attending: Orthopedic Surgery | Admitting: Orthopedic Surgery

## 2019-04-16 ENCOUNTER — Ambulatory Visit (HOSPITAL_COMMUNITY): Payer: Medicare PPO | Admitting: Physician Assistant

## 2019-04-16 ENCOUNTER — Encounter (HOSPITAL_COMMUNITY)
Admission: RE | Disposition: A | Discharge status: Home / Self Care | Payer: Self-pay | Source: Home / Self Care | Attending: Orthopedic Surgery

## 2019-04-16 ENCOUNTER — Ambulatory Visit (HOSPITAL_COMMUNITY): Payer: Medicare PPO | Admitting: Certified Registered"

## 2019-04-16 DIAGNOSIS — Z20822 Contact with and (suspected) exposure to covid-19: Secondary | ICD-10-CM | POA: Diagnosis not present

## 2019-04-16 DIAGNOSIS — Z791 Long term (current) use of non-steroidal anti-inflammatories (NSAID): Secondary | ICD-10-CM | POA: Insufficient documentation

## 2019-04-16 DIAGNOSIS — K219 Gastro-esophageal reflux disease without esophagitis: Secondary | ICD-10-CM | POA: Insufficient documentation

## 2019-04-16 DIAGNOSIS — Z9842 Cataract extraction status, left eye: Secondary | ICD-10-CM | POA: Insufficient documentation

## 2019-04-16 DIAGNOSIS — Z85820 Personal history of malignant melanoma of skin: Secondary | ICD-10-CM | POA: Insufficient documentation

## 2019-04-16 DIAGNOSIS — M19011 Primary osteoarthritis, right shoulder: Secondary | ICD-10-CM | POA: Diagnosis present

## 2019-04-16 DIAGNOSIS — Z79899 Other long term (current) drug therapy: Secondary | ICD-10-CM | POA: Diagnosis not present

## 2019-04-16 DIAGNOSIS — G4730 Sleep apnea, unspecified: Secondary | ICD-10-CM | POA: Diagnosis not present

## 2019-04-16 DIAGNOSIS — E785 Hyperlipidemia, unspecified: Secondary | ICD-10-CM | POA: Diagnosis not present

## 2019-04-16 DIAGNOSIS — M25711 Osteophyte, right shoulder: Secondary | ICD-10-CM | POA: Diagnosis not present

## 2019-04-16 DIAGNOSIS — Z7982 Long term (current) use of aspirin: Secondary | ICD-10-CM | POA: Insufficient documentation

## 2019-04-16 DIAGNOSIS — Z96611 Presence of right artificial shoulder joint: Secondary | ICD-10-CM

## 2019-04-16 DIAGNOSIS — H9190 Unspecified hearing loss, unspecified ear: Secondary | ICD-10-CM | POA: Diagnosis not present

## 2019-04-16 DIAGNOSIS — I10 Essential (primary) hypertension: Secondary | ICD-10-CM | POA: Diagnosis not present

## 2019-04-16 HISTORY — PX: TOTAL SHOULDER ARTHROPLASTY: SHX126

## 2019-04-16 LAB — RESPIRATORY PANEL BY RT PCR (FLU A&B, COVID)
Influenza A by PCR: NEGATIVE
Influenza B by PCR: NEGATIVE
SARS Coronavirus 2 by RT PCR: NEGATIVE

## 2019-04-16 SURGERY — ARTHROPLASTY, SHOULDER, TOTAL
Anesthesia: General | Site: Shoulder | Laterality: Right

## 2019-04-16 MED ORDER — PROPOFOL 10 MG/ML IV BOLUS
INTRAVENOUS | Status: DC | PRN
  Administered 2019-04-16: 150 mg via INTRAVENOUS

## 2019-04-16 MED ORDER — MIDAZOLAM HCL 2 MG/2ML IJ SOLN
INTRAMUSCULAR | Status: DC | PRN
  Administered 2019-04-16 (×2): 1 mg via INTRAVENOUS

## 2019-04-16 MED ORDER — PROPOFOL 10 MG/ML IV BOLUS
INTRAVENOUS | Status: AC
  Filled 2019-04-16: qty 40

## 2019-04-16 MED ORDER — PHENYLEPHRINE 40 MCG/ML (10ML) SYRINGE FOR IV PUSH (FOR BLOOD PRESSURE SUPPORT)
PREFILLED_SYRINGE | INTRAVENOUS | Status: DC | PRN
  Administered 2019-04-16: 40 ug via INTRAVENOUS
  Administered 2019-04-16: 80 ug via INTRAVENOUS

## 2019-04-16 MED ORDER — FENTANYL CITRATE (PF) 100 MCG/2ML IJ SOLN
INTRAMUSCULAR | Status: AC
  Filled 2019-04-16: qty 2

## 2019-04-16 MED ORDER — LACTATED RINGERS IV SOLN: INTRAVENOUS | Status: DC | PRN

## 2019-04-16 MED ORDER — CELECOXIB 200 MG PO CAPS
400.0000 mg | ORAL_CAPSULE | Freq: Once | ORAL | Status: AC
  Administered 2019-04-16: 400 mg via ORAL
  Filled 2019-04-16: qty 2

## 2019-04-16 MED ORDER — SODIUM CHLORIDE 0.9 % IR SOLN
Status: DC | PRN
  Administered 2019-04-16: 1000 mL

## 2019-04-16 MED ORDER — SODIUM CHLORIDE 0.9 % IV SOLN
INTRAVENOUS | Status: DC | PRN
  Administered 2019-04-16 (×2): 1000 mL

## 2019-04-16 MED ORDER — EPHEDRINE SULFATE-NACL 50-0.9 MG/10ML-% IV SOSY
PREFILLED_SYRINGE | INTRAVENOUS | Status: DC | PRN
  Administered 2019-04-16 (×6): 5 mg via INTRAVENOUS

## 2019-04-16 MED ORDER — PHENYLEPHRINE HCL (PRESSORS) 10 MG/ML IV SOLN
INTRAVENOUS | Status: AC
  Filled 2019-04-16: qty 1

## 2019-04-16 MED ORDER — ALBUMIN HUMAN 5 % IV SOLN: INTRAVENOUS | Status: DC | PRN

## 2019-04-16 MED ORDER — DEXAMETHASONE SODIUM PHOSPHATE 10 MG/ML IJ SOLN
INTRAMUSCULAR | Status: AC
  Filled 2019-04-16: qty 1

## 2019-04-16 MED ORDER — ACETAMINOPHEN 500 MG PO TABS
1000.0000 mg | ORAL_TABLET | Freq: Once | ORAL | Status: AC
  Administered 2019-04-16: 1000 mg via ORAL
  Filled 2019-04-16: qty 2

## 2019-04-16 MED ORDER — ONDANSETRON HCL 4 MG/2ML IJ SOLN
INTRAMUSCULAR | Status: AC
  Filled 2019-04-16: qty 2

## 2019-04-16 MED ORDER — TIZANIDINE HCL 4 MG PO TABS: 4.0000 mg | ORAL_TABLET | Freq: Three times a day (TID) | ORAL | 1 refills | Status: AC | PRN

## 2019-04-16 MED ORDER — FENTANYL CITRATE (PF) 250 MCG/5ML IJ SOLN
INTRAMUSCULAR | Status: DC | PRN
  Administered 2019-04-16 (×2): 50 ug via INTRAVENOUS

## 2019-04-16 MED ORDER — DEXAMETHASONE SODIUM PHOSPHATE 10 MG/ML IJ SOLN
INTRAMUSCULAR | Status: DC | PRN
  Administered 2019-04-16: 8 mg via INTRAVENOUS

## 2019-04-16 MED ORDER — PHENYLEPHRINE HCL-NACL 10-0.9 MG/250ML-% IV SOLN
INTRAVENOUS | Status: DC | PRN
  Administered 2019-04-16: 40 ug/min via INTRAVENOUS

## 2019-04-16 MED ORDER — STERILE WATER FOR IRRIGATION IR SOLN
Status: DC | PRN
  Administered 2019-04-16: 1000 mL

## 2019-04-16 MED ORDER — CHLORHEXIDINE GLUCONATE 4 % EX LIQD: 60.0000 mL | Freq: Once | CUTANEOUS | Status: DC

## 2019-04-16 MED ORDER — ALBUMIN HUMAN 5 % IV SOLN
INTRAVENOUS | Status: AC
  Filled 2019-04-16: qty 250

## 2019-04-16 MED ORDER — SUGAMMADEX SODIUM 200 MG/2ML IV SOLN
INTRAVENOUS | Status: DC | PRN
  Administered 2019-04-16: 200 mg via INTRAVENOUS

## 2019-04-16 MED ORDER — OXYCODONE-ACETAMINOPHEN 5-325 MG PO TABS: 1.0000 | ORAL_TABLET | ORAL | 0 refills | Status: AC | PRN

## 2019-04-16 MED ORDER — TRANEXAMIC ACID-NACL 1000-0.7 MG/100ML-% IV SOLN
1000.0000 mg | INTRAVENOUS | Status: AC
  Administered 2019-04-16: 08:00:00 1000 mg via INTRAVENOUS
  Filled 2019-04-16: qty 100

## 2019-04-16 MED ORDER — ONDANSETRON HCL 4 MG/2ML IJ SOLN
INTRAMUSCULAR | Status: DC | PRN
  Administered 2019-04-16: 4 mg via INTRAVENOUS

## 2019-04-16 MED ORDER — MIDAZOLAM HCL 2 MG/2ML IJ SOLN
INTRAMUSCULAR | Status: AC
  Filled 2019-04-16: qty 2

## 2019-04-16 MED ORDER — FENTANYL CITRATE (PF) 100 MCG/2ML IJ SOLN
25.0000 ug | INTRAMUSCULAR | Status: DC | PRN
  Administered 2019-04-16: 50 ug via INTRAVENOUS

## 2019-04-16 MED ORDER — PHENYLEPHRINE 40 MCG/ML (10ML) SYRINGE FOR IV PUSH (FOR BLOOD PRESSURE SUPPORT)
PREFILLED_SYRINGE | INTRAVENOUS | Status: AC
  Filled 2019-04-16: qty 10

## 2019-04-16 MED ORDER — BUPIVACAINE LIPOSOME 1.3 % IJ SUSP
INTRAMUSCULAR | Status: DC | PRN
  Administered 2019-04-16: 10 mL via PERINEURAL

## 2019-04-16 MED ORDER — LACTATED RINGERS IV SOLN: INTRAVENOUS | Status: DC

## 2019-04-16 MED ORDER — BUPIVACAINE HCL (PF) 0.5 % IJ SOLN
INTRAMUSCULAR | Status: DC | PRN
  Administered 2019-04-16: 15 mL

## 2019-04-16 MED ORDER — ROCURONIUM BROMIDE 10 MG/ML (PF) SYRINGE
PREFILLED_SYRINGE | INTRAVENOUS | Status: DC | PRN
  Administered 2019-04-16: 80 mg via INTRAVENOUS

## 2019-04-16 MED ORDER — ROCURONIUM BROMIDE 10 MG/ML (PF) SYRINGE
PREFILLED_SYRINGE | INTRAVENOUS | Status: AC
  Filled 2019-04-16: qty 10

## 2019-04-16 MED ORDER — PROMETHAZINE HCL 25 MG/ML IJ SOLN: 6.2500 mg | INTRAMUSCULAR | Status: DC | PRN

## 2019-04-16 MED ORDER — CEFAZOLIN SODIUM-DEXTROSE 2-4 GM/100ML-% IV SOLN
2.0000 g | INTRAVENOUS | Status: AC
  Administered 2019-04-16: 2 g via INTRAVENOUS
  Filled 2019-04-16: qty 100

## 2019-04-16 MED ORDER — LIDOCAINE 2% (20 MG/ML) 5 ML SYRINGE
INTRAMUSCULAR | Status: AC
  Filled 2019-04-16: qty 5

## 2019-04-16 SURGICAL SUPPLY — 77 items
AID PSTN UNV HD RSTRNT DISP (MISCELLANEOUS) ×1
APL PRP STRL LF DISP 70% ISPRP (MISCELLANEOUS) ×2
BAG SPEC THK2 15X12 ZIP CLS (MISCELLANEOUS) ×1
BAG ZIPLOCK 12X15 (MISCELLANEOUS) ×3 IMPLANT
BIT DRILL 1.6MX128 (BIT) ×2 IMPLANT
BIT DRILL 1.6MX128MM (BIT) ×1
BLADE SAW SAG 73X25 THK (BLADE) ×2
BLADE SAW SGTL 18X1.27X75 (BLADE) IMPLANT
BLADE SAW SGTL 18X1.27X75MM (BLADE)
BLADE SAW SGTL 73X25 THK (BLADE) ×1 IMPLANT
CEMENT BONE DEPUY (Cement) ×3 IMPLANT
CHLORAPREP W/TINT 26 (MISCELLANEOUS) ×6 IMPLANT
CLOSURE STERI-STRIP 1/2X4 (GAUZE/BANDAGES/DRESSINGS) ×1
CLOSURE WOUND 1/2 X4 (GAUZE/BANDAGES/DRESSINGS) ×1
CLSR STERI-STRIP ANTIMIC 1/2X4 (GAUZE/BANDAGES/DRESSINGS) ×1 IMPLANT
COOLER ICEMAN CLASSIC (MISCELLANEOUS) ×2 IMPLANT
COVER BACK TABLE 60X90IN (DRAPES) ×3 IMPLANT
COVER SURGICAL LIGHT HANDLE (MISCELLANEOUS) ×3 IMPLANT
COVER WAND RF STERILE (DRAPES) ×2 IMPLANT
DRAPE INCISE IOBAN 66X45 STRL (DRAPES) ×3 IMPLANT
DRAPE ORTHO SPLIT 77X108 STRL (DRAPES) ×6
DRAPE POUCH INSTRU U-SHP 10X18 (DRAPES) ×3 IMPLANT
DRAPE SHEET LG 3/4 BI-LAMINATE (DRAPES) ×6 IMPLANT
DRAPE SURG 17X11 SM STRL (DRAPES) ×3 IMPLANT
DRAPE SURG ORHT 6 SPLT 77X108 (DRAPES) ×2 IMPLANT
DRAPE U-SHAPE 47X51 STRL (DRAPES) ×3 IMPLANT
DRSG AQUACEL AG ADV 3.5X 6 (GAUZE/BANDAGES/DRESSINGS) ×3 IMPLANT
ELECT BLADE TIP CTD 4 INCH (ELECTRODE) ×3 IMPLANT
ELECT REM PT RETURN 15FT ADLT (MISCELLANEOUS) ×3 IMPLANT
GLENOID PEGGED CORTILOC M40 (Orthopedic Implant) IMPLANT
GLOVE BIO SURGEON STRL SZ7 (GLOVE) ×3 IMPLANT
GLOVE BIO SURGEON STRL SZ7.5 (GLOVE) ×3 IMPLANT
GLOVE BIOGEL PI IND STRL 7.0 (GLOVE) ×1 IMPLANT
GLOVE BIOGEL PI IND STRL 8 (GLOVE) ×1 IMPLANT
GLOVE BIOGEL PI INDICATOR 7.0 (GLOVE) ×2
GLOVE BIOGEL PI INDICATOR 8 (GLOVE) ×2
GOWN STRL REUS W/TWL LRG LVL3 (GOWN DISPOSABLE) ×6 IMPLANT
GOWN STRL REUS W/TWL XL LVL3 (GOWN DISPOSABLE) ×3 IMPLANT
GUIDEWIRE GLENOID 2.5X220 (WIRE) ×2 IMPLANT
HANDPIECE INTERPULSE COAX TIP (DISPOSABLE) ×3
HEAD HUMERAL AEQUALIS 48X18 (Head) ×3 IMPLANT
HEAD HUMERAL HIGH OS 48X18 (Head) IMPLANT
HEMOSTAT SURGICEL 2X14 (HEMOSTASIS) ×2 IMPLANT
HOOD PEEL AWAY FLYTE STAYCOOL (MISCELLANEOUS) ×9 IMPLANT
KIT BASIN OR (CUSTOM PROCEDURE TRAY) ×3 IMPLANT
KIT TURNOVER KIT A (KITS) IMPLANT
MANIFOLD NEPTUNE II (INSTRUMENTS) ×3 IMPLANT
NDL TROCAR POINT SZ 2 1/2 (NEEDLE) ×1 IMPLANT
NEEDLE TROCAR POINT SZ 2 1/2 (NEEDLE) ×3 IMPLANT
NS IRRIG 1000ML POUR BTL (IV SOLUTION) ×3 IMPLANT
PACK SHOULDER (CUSTOM PROCEDURE TRAY) ×3 IMPLANT
PAD COLD SHLDR WRAP-ON (PAD) ×2 IMPLANT
PEGGED GLENOID CORTILOC (Orthopedic Implant) ×2 IMPLANT
PROTECTOR NERVE ULNAR (MISCELLANEOUS) ×3 IMPLANT
RESTRAINT HEAD UNIVERSAL NS (MISCELLANEOUS) ×3 IMPLANT
RETRIEVER SUT HEWSON (MISCELLANEOUS) ×3 IMPLANT
SET HNDPC FAN SPRY TIP SCT (DISPOSABLE) ×1 IMPLANT
SLING ARM FOAM STRAP LRG (SOFTGOODS) ×2 IMPLANT
SLING ARM IMMOBILIZER LRG (SOFTGOODS) ×1 IMPLANT
SMARTMIX MINI TOWER (MISCELLANEOUS) ×3
SPONGE LAP 18X18 RF (DISPOSABLE) ×1 IMPLANT
STEM HUMERAL SZ2B STND 70 PTC (Stem) ×3 IMPLANT
STEM HUMERAL SZ2BSTD 70 PTC (Stem) IMPLANT
STRIP CLOSURE SKIN 1/2X4 (GAUZE/BANDAGES/DRESSINGS) ×2 IMPLANT
SUCTION FRAZIER HANDLE 12FR (TUBING) ×3
SUCTION TUBE FRAZIER 12FR DISP (TUBING) ×1 IMPLANT
SUPPORT WRAP ARM LG (MISCELLANEOUS) ×3 IMPLANT
SUT ETHIBOND 2 V 37 (SUTURE) ×5 IMPLANT
SUT MNCRL AB 4-0 PS2 18 (SUTURE) ×3 IMPLANT
SUT VIC AB 2-0 CT1 27 (SUTURE) ×6
SUT VIC AB 2-0 CT1 TAPERPNT 27 (SUTURE) ×1 IMPLANT
TAPE LABRALWHITE 1.5X36 (TAPE) ×2 IMPLANT
TAPE SUT LABRALTAP WHT/BLK (SUTURE) ×2 IMPLANT
TOWEL OR 17X26 10 PK STRL BLUE (TOWEL DISPOSABLE) ×3 IMPLANT
TOWER SMARTMIX MINI (MISCELLANEOUS) ×1 IMPLANT
WATER STERILE IRR 1000ML POUR (IV SOLUTION) ×3 IMPLANT
YANKAUER SUCT BULB TIP 10FT TU (MISCELLANEOUS) ×3 IMPLANT

## 2019-04-16 NOTE — Anesthesia Postprocedure Evaluation (Signed)
Anesthesia Post Note  Patient: Travis Arnold  Procedure(s) Performed: TOTAL SHOULDER ARTHROPLASTY (Right Shoulder)     Patient location during evaluation: PACU Anesthesia Type: General Level of consciousness: sedated Pain management: pain level controlled Vital Signs Assessment: post-procedure vital signs reviewed and stable Respiratory status: spontaneous breathing and respiratory function stable Cardiovascular status: stable Postop Assessment: no apparent nausea or vomiting Anesthetic complications: no    Last Vitals:  Vitals:   04/16/19 1015 04/16/19 1030  BP: 113/66 (!) 108/56  Pulse: (!) 51 (!) 53  Resp: 15   Temp:  36.4 C  SpO2: 94% 94%    Last Pain:  Vitals:   04/16/19 1015  TempSrc:   PainSc: 4                  Korrin Waterfield DANIEL

## 2019-04-16 NOTE — Anesthesia Procedure Notes (Signed)
Procedure Name: Intubation Date/Time: 04/16/2019 7:36 AM Performed by: Eben Burow, CRNA Pre-anesthesia Checklist: Patient identified, Emergency Drugs available, Suction available, Patient being monitored and Timeout performed Patient Re-evaluated:Patient Re-evaluated prior to induction Oxygen Delivery Method: Circle system utilized Preoxygenation: Pre-oxygenation with 100% oxygen Induction Type: IV induction Ventilation: Mask ventilation without difficulty Laryngoscope Size: Mac and 4 Grade View: Grade I Tube type: Oral Tube size: 7.5 mm Number of attempts: 1 Airway Equipment and Method: Stylet Placement Confirmation: ETT inserted through vocal cords under direct vision,  positive ETCO2 and breath sounds checked- equal and bilateral Secured at: 22 cm Tube secured with: Tape Dental Injury: Teeth and Oropharynx as per pre-operative assessment

## 2019-04-16 NOTE — Anesthesia Procedure Notes (Signed)
Anesthesia Regional Block: Interscalene brachial plexus block   Pre-Anesthetic Checklist: ,, timeout performed, Correct Patient, Correct Site, Correct Laterality, Correct Procedure,, site marked, risks and benefits discussed, Surgical consent,  Pre-op evaluation,  At surgeon's request and post-op pain management  Laterality: Right  Prep: chloraprep       Needles:  Injection technique: Single-shot  Needle Type: Echogenic Stimulator Needle     Needle Length: 5cm  Needle Gauge: 22     Additional Needles:   Procedures:, nerve stimulator,,,, intact distal pulses,,,   Nerve Stimulator or Paresthesia:  Response: bicep contraction, 0.48 mA,   Additional Responses:   Narrative:  Start time: 04/16/2019 7:01 AM End time: 04/16/2019 7:11 AM Injection made incrementally with aspirations every 5 mL.  Performed by: Personally   Additional Notes: Functioning IV was confirmed and monitors applied.  A 29mm 22ga echogenic arrow stimulator was used. Sterile prep and drape,hand hygiene and sterile gloves were used.Ultrasound guidance: relevent anatomy identified, needle position confirmed, local anesthetic spread visualized around nerve(s)., vascular puncture avoided.  Image printed for medical record.  Negative aspiration and negative test dose prior to incremental administration of local anesthetic. The patient tolerated the procedure well.

## 2019-04-16 NOTE — Transfer of Care (Signed)
Immediate Anesthesia Transfer of Care Note  Patient: Travis Arnold  Procedure(s) Performed: TOTAL SHOULDER ARTHROPLASTY (Right Shoulder)  Patient Location: PACU  Anesthesia Type:General  Level of Consciousness: awake, drowsy and patient cooperative  Airway & Oxygen Therapy: Patient Spontanous Breathing and Patient connected to face mask oxygen  Post-op Assessment: Report given to RN and Post -op Vital signs reviewed and stable  Post vital signs: Reviewed and stable  Last Vitals:  Vitals Value Taken Time  BP    Temp    Pulse 60 04/16/19 0934  Resp 14 04/16/19 0934  SpO2 99 % 04/16/19 0934  Vitals shown include unvalidated device data.  Last Pain:  Vitals:   04/16/19 0610  TempSrc: Oral  PainSc: 3       Patients Stated Pain Goal: (unable) ([id number])  Complications: No apparent anesthesia complications

## 2019-04-16 NOTE — Op Note (Signed)
Procedure(s): TOTAL SHOULDER ARTHROPLASTY Procedure Note  Travis Arnold male 69 y.o. 04/16/2019  Preoperative diagnosis: Right shoulder end-stage arthritis  Postoperative diagnosis: Same  Procedure(s) and Anesthesia Type:    Right TOTAL SHOULDER ARTHROPLASTY - General  Surgeon(s) and Role:    Tania Ade, MD - Primary   Indications:  69 y.o. male  With endstage right shoulder arthritis. Pain and dysfunction interfered with quality of life and nonoperative treatment with activity modification, NSAIDS and injections failed.     Surgeon: Isabella Stalling   Assistants: Jeanmarie Hubert PA-C [facility] was present and scrubbed throughout the procedure and was essential in positioning, retraction, exposure, and closure)  Anesthesia: General endotracheal anesthesia with preoperative interscalene block given by the attending anesthesiologist   Procedure Detail  TOTAL SHOULDER ARTHROPLASTY  Findings: Tornier flex anatomic press-fit size 2 stem with a 48 head, cemented size 40 medium Cortiloc glenoid.   A lesser tuberosity osteotomy was performed and repaired at the conclusion of the procedure.  Estimated Blood Loss:  200 mL         Drains: None   Blood Given: none          Specimens: none        Complications:  * No complications entered in OR log *         Disposition: PACU - hemodynamically stable.         Condition: stable    Procedure:   The patient was identified in the preoperative holding area where I personally marked the operative extremity after verifying with the patient and consent. He  was taken to the operating room where He was transferred to the   operative table.  The patient received an interscalene block in   the holding area by the attending anesthesiologist.  General anesthesia was induced   in the operating room without complication.  The patient did receive IV  Ancef prior to the commencement of the procedure.  The patient was   placed in  the beach-chair position with the back raised about 30   degrees.  The nonoperative extremity and head and neck were carefully   positioned and padded protecting against neurovascular compromise.  The   left upper extremity was then prepped and draped in the standard sterile   fashion.    The appropriate operative time-out was performed with   Anesthesia, the perioperative staff, as well as myself and we all agreed   that the right side was the correct operative site.  An approximately   10 cm incision was made from the tip of the coracoid to the center point of the   humerus at the level of the axilla.  Dissection was carried down sharply   through subcutaneous tissues and cephalic vein was identified and taken   laterally with the deltoid.  The pectoralis major was taken medially.  The   upper 1 cm of the pectoralis major was released from its attachment on   the humerus.  The clavipectoral fascia was incised just lateral to the   conjoined tendon.  This incision was carried up to but not into the   coracoacromial ligament.  Digital palpation was used to prove   integrity of the axillary nerve which was protected throughout the   procedure.  Musculocutaneous nerve was not palpated in the operative   field.  Conjoined tendon was then retracted gently medially and the   deltoid laterally.  Anterior circumflex humeral vessels were clamped and  coagulated.  The soft tissues overlying the biceps was incised and this   incision was carried across the transverse humeral ligament to the base   of the coracoid.  The biceps was noted to be severely degenerated. It was released from the superior labrum. The biceps was then tenodesed to the soft tissue just above   pectoralis major and the remaining portion of the biceps superiorly was   excised.  An osteotomy was performed at the lesser tuberosity.  The capsule was then   released all the way down to the 6 o'clock position of the humeral head.    The humeral head was then delivered with simultaneous adduction,   extension and external rotation.  All humeral osteophytes were removed   and the anatomic neck of the humerus was marked and cut free hand at   approximately 25 degrees retroversion within about 3 mm of the cuff   reflection posteriorly.  The head size was estimated to be a 48 medium   offset.  At that point, the humeral head was retracted posteriorly with   a Fukuda retractor.   Remaining portion of the capsule was released at the base of the   coracoid.  The remaining biceps anchor and the entire anterior-inferior   labrum was excised.  The posterior labrum was also excised but the   posterior capsule was not released.  The guidepin was placed bicortically with non elevated guide.  The reamer was used to ream to concentric bone with punctate bleeding.  This gave an excellent concentric surface.  The center hole was then drilled for an anchor peg glenoid followed by the three peripheral holes and none of the holes   exited the glenoid wall.  I then pulse irrigated these holes and dried   them with Surgicel.  The three peripheral holes were then   pressurized cemented and the anchor peg glenoid was placed and impacted   with an excellent fit.  The glenoid was a 40 medium component.  The proximal humerus was then again exposed taking care not to displace the glenoid.    The entry awl was used followed by sounding reamers and then sequentially broached from size 1-2. This was then left in place and the calcar planer was used. Trial head was placed with a 48 high offset.  With the trial implantation of the component,  there was approximately 50% posterior translation with immediate snap back to the   anatomic position.  With forward elevation, there was no tendency   towards posterior subluxation.   The trial was removed and the final implant was prepared on a back table.  The trial was removed and the final implant was prepared on a  back table.   3 small holes were drilled on the medial side of the lesser tuberosity osteotomy, through which 2 labral tapes were passed. The implant was then placed through the loop of the 2 labral tapes and impacted with an excellent press-fit. This achieved excellent anatomic reconstruction of the proximal humerus.  The joint was then copiously irrigated with pulse lavage.  The subscapularis and   lesser tuberosity osteotomy were then repaired using the 2 labral tapes previously passed in a double row fashion with horizontal mattress sutures medially brought over through bone tunnels tied over a bone bridge laterally.   One #1 Ethibond was placed at the rotator interval just above   the lesser tuberosity. Copious irrigation was used. Skin was closed with 2-0 Vicryl sutures in  the deep dermal layer and 4-0 Monocryl in a subcuticular  running fashion.  Sterile dressings were then applied including Aquacel.  The patient was placed in a sling and allowed to awaken from general anesthesia and taken to the recovery room in stable condition.      POSTOPERATIVE PLAN:  Early passive range of motion will be allowed with the goal of 0 degrees external rotation and 90 degrees forward elevation.  No internal rotation at this time.  No active motion of the arm until the lesser tuberosity heals.  The patient will likely be observed in the recovery room and if his pain is well controlled and he is hemodynamically stable he can be discharged home today with family.

## 2019-04-16 NOTE — Progress Notes (Signed)
Occupational Therapy Evaluation Patient Details Name: FLOYED TORGERSON MRN: [id number] DOB: 23-Sep-1950 Today's Date: 04/16/2019    History of Present Illness 69 y.o. male  With endstage right shoulder arthritis. Pain and dysfunction interfered with quality of life and nonoperative treatment with activity modification, NSAIDS and injections failed. R Total Shoulder Arthroplasty on 04/16/19.   Clinical Impression   Completed all education regarding compensatory strategies and management of R UE per protocol. Written information provided and reviewed. Pt and spouse verbalized understanding. Pt to follow up with Dr. Tamera Punt for further therapy needs    Follow Up Recommendations  Follow surgeon's recommendation for DC plan and follow-up therapies;Supervision - Intermittent    Equipment Recommendations  None recommended by OT    Recommendations for Other Services       Precautions / Restrictions Precautions Precautions: Shoulder Type of Shoulder Precautions: (NWB to R UE, shoulder flexion to 90 degrees, no ER) Shoulder Interventions: At all times;Shoulder sling/immobilizer;Off for dressing/bathing/exercises Precaution Comments: (information in handout) Restrictions Weight Bearing Restrictions: Yes RUE Weight Bearing: Non weight bearing      Mobility Bed Mobility               General bed mobility comments: patient and spouse educated on bed mobility techniques  for home   Transfers Overall transfer level: Independent                    Balance Overall balance assessment: No apparent balance deficits (not formally assessed)                                         ADL either performed or assessed with clinical judgement   ADL Overall ADL's : Needs assistance/impaired Eating/Feeding: Set up   Grooming: Set up   Upper Body Bathing: Minimal assistance   Lower Body Bathing: Minimal assistance   Upper Body Dressing : Minimal assistance   Lower  Body Dressing: Minimal assistance   Toilet Transfer: Supervision/safety   Toileting- Clothing Manipulation and Hygiene: Supervision/safety   Tub/ Shower Transfer: Supervision/safety   Functional mobility during ADLs: Supervision/safety       Vision Baseline Vision/History: No visual deficits       Perception     Praxis      Pertinent Vitals/Pain Pain Assessment: 0-10 Pain Score: 3  Pain Location: R shoulder Pain Descriptors / Indicators: Burning Pain Intervention(s): Repositioned     Hand Dominance Right   Extremity/Trunk Assessment Upper Extremity Assessment Upper Extremity Assessment: RUE deficits/detail RUE: Unable to fully assess due to immobilization   Lower Extremity Assessment Lower Extremity Assessment: Overall WFL for tasks assessed       Communication Communication Communication: Deaf;Interpreter utilized   Cognition Arousal/Alertness: Awake/alert Behavior During Therapy: WFL for tasks assessed/performed Overall Cognitive Status: Within Functional Limits for tasks assessed                                     General Comments       Exercises Exercises: Hand exercises;General Upper Extremity General Exercises - Upper Extremity Elbow Flexion: AROM;10 reps Elbow Extension: AROM;10 reps Wrist Flexion: AROM;10 reps Wrist Extension: AROM;10 reps Digit Composite Flexion: AROM;10 reps Composite Extension: AROM;10 reps Hand Exercises Forearm Supination: AROM;10 reps Forearm Pronation: AROM;10 reps   Shoulder Instructions      Home Living Family/patient  expects to be discharged to:: Private residence Living Arrangements: Spouse/significant other Available Help at Discharge: Family Type of Home: House Home Access: Stairs to enter Technical brewer of Steps: 4 Entrance Stairs-Rails: None       Bathroom Shower/Tub: Teacher, early years/pre: Standard Bathroom Accessibility: Yes How Accessible: Accessible via  walker Home Equipment: None          Prior Functioning/Environment Level of Independence: Independent                 OT Problem List:        OT Treatment/Interventions:      OT Goals(Current goals can be found in the care plan section)    OT Frequency:     Barriers to D/C:            Co-evaluation              AM-PAC OT "6 Clicks" Daily Activity     Outcome Measure Help from another person eating meals?: A Little Help from another person taking care of personal grooming?: A Little Help from another person toileting, which includes using toliet, bedpan, or urinal?: A Little Help from another person bathing (including washing, rinsing, drying)?: A Little Help from another person to put on and taking off regular upper body clothing?: A Little Help from another person to put on and taking off regular lower body clothing?: A Little 6 Click Score: 18   End of Session Nurse Communication: Precautions  Activity Tolerance: Patient tolerated treatment well Patient left: in bed;with call bell/phone within reach;with family/visitor present                   Time: 1050-1136 OT Time Calculation (min): 46 min Charges:  OT General Charges $OT Visit: 1 Visit OT Evaluation $OT Eval Low Complexity: 1 Low OT Treatments $Self Care/Home Management : 8-22 mins $Therapeutic Exercise: 8-22 mins  Amitai Delaughter OTR/L   Peytyn Trine 04/16/2019, 11:57 AM

## 2019-04-16 NOTE — H&P (Signed)
Travis Arnold is an 70 y.o. male.   Chief Complaint: R shoulder pain and dysfunction HPI: Endstage R shoulder arthritis with significant pain and dysfunction, failed conservative measures.  Pain interferes with sleep and quality of life.   Past Medical History:  Diagnosis Date  . Arthritis   . Arthritis of right shoulder region   . Bronchitis    hx  . Cancer ([place])    skin melanoma back  . Deaf   . GERD (gastroesophageal reflux disease)   . Headache(784.0)   . History of stress test 2000   many yrs. ago due to family history of several cardiac deaths in family prior to age 37   . Hypertension   . Pneumonia   . Sleep apnea 2006   does not use cpap-pcp says not needed-mild  . Sleep disturbance    given CPAP machine, but needs new study, doesn't use CPAP currently   . Wears hearing aid    both ears, but mostly deaf    Past Surgical History:  Procedure Laterality Date  . CATARACT EXTRACTION Left 11  . CERVICAL DISC SURGERY  98.11.   x2  . EYE SURGERY Left    detached retina  . HERNIA REPAIR Bilateral 73,77,98   several both sides  . POSTERIOR CERVICAL FUSION/FORAMINOTOMY N/A 02/18/2013   Procedure: POSTERIOR CERVICAL FUSION/C3 - C4, C4 - C5, C5 - C6, C6 - C7, C7 - T1;  Surgeon: Sinclair Ship, MD;  Location: [facility];  Service: Orthopedics;  Laterality: N/A;  Posterior cervical fusion, cervical 3-4, cervical 4-5, cervical 5-6, cervical 6-7, cervical 7-thoracic 1 with instrumentation, allograft.  Marland Kitchen SHOULDER ARTHROSCOPY Bilateral 09   spurs  . SHOULDER ARTHROSCOPY Right 10   spurs  . SHOULDER ARTHROSCOPY Right 01/05/2019   Procedure: ARTHROSCOPY SHOULDER DEBRIDEMENT CHONDROMALACIA  AND BICEPS TENOTOMY;  Surgeon: Tania Ade, MD;  Location: [place] [facility];  Service: Orthopedics;  Laterality: Right;  . SHOULDER ARTHROSCOPY WITH SUBACROMIAL DECOMPRESSION Left 02/22/2014   Procedure: LEFT SHOULDER ARTHROSCOPY WITH DEBRIDEMENT, CHONDROMALACIA & subacromial  decompression ;  Surgeon: Nita Sells, MD;  Location: [place];  Service: Orthopedics;  Laterality: Left;  . TONSILLECTOMY      No family history on file. Social History:  reports that he has never smoked. He has never used smokeless tobacco. He reports current alcohol use. He reports that he does not use drugs.  Allergies: No Known Allergies  Medications Prior to Admission  Medication Sig Dispense Refill  . aspirin 81 MG tablet Take 81 mg by mouth daily.    Marland Kitchen atorvastatin (LIPITOR) 40 MG tablet Take 40 mg by mouth daily.    . baclofen (LIORESAL) 10 MG tablet Take 10 mg by mouth 2 (two) times daily as needed for muscle spasms.    Marland Kitchen doxazosin (CARDURA) 4 MG tablet Take 4 mg by mouth every evening.    Marland Kitchen esomeprazole (NEXIUM) 40 MG capsule Take 40 mg by mouth daily.     . finasteride (PROSCAR) 5 MG tablet Take 5 mg by mouth every morning.    Marland Kitchen lisinopril (ZESTRIL) 10 MG tablet Take 10 mg by mouth daily.    . metoprolol (LOPRESSOR) 50 MG tablet Take 50 mg by mouth 2 (two) times daily.     . Multiple Vitamin (MULTIVITAMIN WITH MINERALS) TABS tablet Take 1 tablet by mouth daily.    . Multiple Vitamins-Minerals (PRESERVISION AREDS 2) CAPS Take 1 capsule by mouth in the morning and at bedtime.    Marland Kitchen  Probiotic CAPS Take 1 capsule by mouth daily.    Marland Kitchen docusate sodium (COLACE) 100 MG capsule Take 1 capsule (100 mg total) by mouth 3 (three) times daily as needed. (Patient taking differently: Take 100 mg by mouth daily as needed for mild constipation. ) 20 capsule 0  . loperamide (IMODIUM A-D) 2 MG tablet Take 2 mg by mouth 4 (four) times daily as needed for diarrhea or loose stools.     . meclizine (ANTIVERT) 25 MG tablet Take 25 mg by mouth 3 (three) times daily as needed for dizziness.    . meloxicam (MOBIC) 15 MG tablet Take 15 mg by mouth daily.    Marland Kitchen oxyCODONE-acetaminophen (ROXICET) 5-325 MG tablet Take 1-2 tablets by mouth every 4 (four) hours as needed for severe pain  (max 6/day). 30 tablet 0  . traMADol (ULTRAM) 50 MG tablet Take 50 mg by mouth every 6 (six) hours as needed for moderate pain.       Results for orders placed or performed during the hospital encounter of 04/16/19 (from the past 48 hour(s))  Respiratory Panel by RT PCR (Flu A&B, Covid) - Nasopharyngeal Swab     Status: None   Collection Time: 04/16/19  5:36 AM   Specimen: Nasopharyngeal Swab  Result Value Ref Range   SARS Coronavirus 2 by RT PCR NEGATIVE NEGATIVE    Comment: (NOTE) SARS-CoV-2 target nucleic acids are NOT DETECTED. The SARS-CoV-2 RNA is generally detectable in upper respiratoy specimens during the acute phase of infection. The lowest concentration of SARS-CoV-2 viral copies this assay can detect is 131 copies/mL. A negative result does not preclude SARS-Cov-2 infection and should not be used as the sole basis for treatment or other patient management decisions. A negative result may occur with  improper specimen collection/handling, submission of specimen other than nasopharyngeal swab, presence of viral mutation(s) within the areas targeted by this assay, and inadequate number of viral copies (<131 copies/mL). A negative result must be combined with clinical observations, patient history, and epidemiological information. The expected result is Negative. Fact Sheet for Patients:  PinkCheek.be Fact Sheet for Healthcare Providers:  GravelBags.it This test is not yet ap proved or cleared by the [place] FDA and  has been authorized for detection and/or diagnosis of SARS-CoV-2 by FDA under an Emergency Use Authorization (EUA). This EUA will remain  in effect (meaning this test can be used) for the duration of the COVID-19 declaration under Section 564(b)(1) of the Act, 21 U.S.C. section 360bbb-3(b)(1), unless the authorization is terminated or revoked sooner.    Influenza A by PCR NEGATIVE NEGATIVE    Influenza B by PCR NEGATIVE NEGATIVE    Comment: (NOTE) The Xpert Xpress SARS-CoV-2/FLU/RSV assay is intended as an aid in  the diagnosis of influenza from Nasopharyngeal swab specimens and  should not be used as a sole basis for treatment. Nasal washings and  aspirates are unacceptable for Xpert Xpress SARS-CoV-2/FLU/RSV  testing. Fact Sheet for Patients: PinkCheek.be Fact Sheet for Healthcare Providers: GravelBags.it This test is not yet approved or cleared by the [place] FDA and  has been authorized for detection and/or diagnosis of SARS-CoV-2 by  FDA under an Emergency Use Authorization (EUA). This EUA will remain  in effect (meaning this test can be used) for the duration of the  Covid-19 declaration under Section 564(b)(1) of the Act, 21  U.S.C. section 360bbb-3(b)(1), unless the authorization is  terminated or revoked. Performed at [facility], [address] [place], [place]  [address]    No results found.  Review of Systems  All other systems reviewed and are negative.   Blood pressure 118/77, pulse (!) 53, temperature 97.7 F (36.5 C), temperature source Oral, resp. rate 18, weight 84.8 kg, SpO2 95 %. Physical Exam  Constitutional: He is oriented to person, place, and time. He appears well-developed and well-nourished.  HENT:  Head: Atraumatic.  Eyes: EOM are normal.  Cardiovascular: Intact distal pulses.  Respiratory: Effort normal.  Musculoskeletal:     Comments: R shoulder pain with limited ROM. NVID.  Neurological: He is alert and oriented to person, place, and time.  Skin: Skin is warm and dry.  Psychiatric: He has a normal mood and affect.     Assessment/Plan R shoulder endstage arthritis Plan R TSA Risks / benefits of surgery discussed Consent on chart  NPO for OR Preop antibiotics   Isabella Stalling, MD 04/16/2019, 7:17 AM

## 2019-04-16 NOTE — Discharge Instructions (Signed)
Discharge Instructions after Total Shoulder Arthroplasty   A sling has been provided for you. Remove the sling 5 times each day to perform motion exercises. After the first 48 to 72 hours, discontinue using the sling. You should use the sling as a protective device, if you are in a crowd.  Use ice on the shoulder intermittently over the first 48 hours after surgery.  Pain medication has been prescribed for you.  Use your medication liberally over the first 48 hours, and then begin to taper your use. You may take Extra Strength Tylenol or Tylenol only in place of the pain pills. DO NOT take ANY nonsteroidal anti-inflammatory pain medications: Advil, Motrin, Ibuprofen, Aleve, Naproxen, or Naprosyn. Take one aspirin a day for 2 weeks after surgery, unless you have an aspirin sensitivity/allergy or asthma. Leave your dressing on until your first follow up visit.  You may shower with the dressing.  Hold your arm as if you still have your sling on while you shower. Active reaching and lifting are not permitted. You may use the operative arm for activities of daily living that do not require the operative arm to leave the side of the body, such as eating, drinking, bathing, etc.  Three to 5 times each day you should perform assisted overhead reaching and external rotation (outward turning) exercises with the operative arm. You were taught these exercises prior to discharge. Both exercises should be done with the non-operative arm used as the "therapist arm" while the operative arm remains relaxed. Ten of each exercise should be done three to five times each day.   Overhead reach is helping to lift your stiff arm up as high as it will go. To stretch your overhead reach, lie flat on your back, relax, and grasp the wrist of the tight shoulder with your opposite hand. Using the power in your opposite arm, bring the stiff arm up as far as it is comfortable. Start holding it for ten seconds and then work up to where  you can hold it for a count of 30. Breathe slowly and deeply while the arm is moved. Repeat this stretch ten times, trying to help the ar up a little higher each time.     External rotation is turning the arm out to the side while your elbow stays close to your body. External rotation is best stretched while you are lying on your back. Hold a cane, yardstick, broom handle, or dowel in both hands. Bend both elbows to a right angle. Use steady, gentle force from your normal arm to rotate the hand of the stiff shoulder out away from your body. Continue the rotation until it is straight in front of you holding it there for a count of 10. Do not go beyond this level of rotation until seen back by Dr. Chandler. Repeat this exercise ten times slowly.      Please call [phone] during normal business hours or [phone] after hours for any problems. Including the following:  - excessive redness of the incisions - drainage for more than 4 days - fever of more than 101.5 F  *Please note that pain medications will not be refilled after hours or on weekends.    

## 2019-04-17 ENCOUNTER — Encounter: Payer: Self-pay | Admitting: *Deleted

## 2019-06-15 DIAGNOSIS — M25611 Stiffness of right shoulder, not elsewhere classified: Secondary | ICD-10-CM | POA: Diagnosis not present

## 2019-06-15 DIAGNOSIS — M6281 Muscle weakness (generalized): Secondary | ICD-10-CM | POA: Diagnosis not present

## 2019-06-15 DIAGNOSIS — Z96611 Presence of right artificial shoulder joint: Secondary | ICD-10-CM | POA: Diagnosis not present

## 2019-06-17 DIAGNOSIS — M6281 Muscle weakness (generalized): Secondary | ICD-10-CM | POA: Diagnosis not present

## 2019-06-17 DIAGNOSIS — M25611 Stiffness of right shoulder, not elsewhere classified: Secondary | ICD-10-CM | POA: Diagnosis not present

## 2019-06-17 DIAGNOSIS — Z96611 Presence of right artificial shoulder joint: Secondary | ICD-10-CM | POA: Diagnosis not present

## 2019-06-23 DIAGNOSIS — Z96611 Presence of right artificial shoulder joint: Secondary | ICD-10-CM | POA: Diagnosis not present

## 2019-06-23 DIAGNOSIS — M6281 Muscle weakness (generalized): Secondary | ICD-10-CM | POA: Diagnosis not present

## 2019-06-23 DIAGNOSIS — M25611 Stiffness of right shoulder, not elsewhere classified: Secondary | ICD-10-CM | POA: Diagnosis not present

## 2019-06-24 DIAGNOSIS — M25611 Stiffness of right shoulder, not elsewhere classified: Secondary | ICD-10-CM | POA: Diagnosis not present

## 2019-06-24 DIAGNOSIS — M6281 Muscle weakness (generalized): Secondary | ICD-10-CM | POA: Diagnosis not present

## 2019-06-24 DIAGNOSIS — Z96611 Presence of right artificial shoulder joint: Secondary | ICD-10-CM | POA: Diagnosis not present

## 2019-06-25 DIAGNOSIS — G4733 Obstructive sleep apnea (adult) (pediatric): Secondary | ICD-10-CM | POA: Diagnosis not present

## 2019-06-25 DIAGNOSIS — E782 Mixed hyperlipidemia: Secondary | ICD-10-CM | POA: Diagnosis not present

## 2019-06-25 DIAGNOSIS — M47816 Spondylosis without myelopathy or radiculopathy, lumbar region: Secondary | ICD-10-CM | POA: Diagnosis not present

## 2019-06-25 DIAGNOSIS — K219 Gastro-esophageal reflux disease without esophagitis: Secondary | ICD-10-CM | POA: Diagnosis not present

## 2019-06-25 DIAGNOSIS — Z125 Encounter for screening for malignant neoplasm of prostate: Secondary | ICD-10-CM | POA: Diagnosis not present

## 2019-06-25 DIAGNOSIS — Z5181 Encounter for therapeutic drug level monitoring: Secondary | ICD-10-CM | POA: Diagnosis not present

## 2019-06-25 DIAGNOSIS — I10 Essential (primary) hypertension: Secondary | ICD-10-CM | POA: Diagnosis not present

## 2019-06-30 DIAGNOSIS — M25611 Stiffness of right shoulder, not elsewhere classified: Secondary | ICD-10-CM | POA: Diagnosis not present

## 2019-06-30 DIAGNOSIS — Z96611 Presence of right artificial shoulder joint: Secondary | ICD-10-CM | POA: Diagnosis not present

## 2019-06-30 DIAGNOSIS — M6281 Muscle weakness (generalized): Secondary | ICD-10-CM | POA: Diagnosis not present

## 2019-07-02 DIAGNOSIS — Z96611 Presence of right artificial shoulder joint: Secondary | ICD-10-CM | POA: Diagnosis not present

## 2019-07-02 DIAGNOSIS — M25611 Stiffness of right shoulder, not elsewhere classified: Secondary | ICD-10-CM | POA: Diagnosis not present

## 2019-07-02 DIAGNOSIS — M6281 Muscle weakness (generalized): Secondary | ICD-10-CM | POA: Diagnosis not present

## 2019-07-07 ENCOUNTER — Other Ambulatory Visit: Payer: Self-pay | Admitting: General Surgery

## 2019-07-07 DIAGNOSIS — R1032 Left lower quadrant pain: Secondary | ICD-10-CM

## 2019-07-08 DIAGNOSIS — M25611 Stiffness of right shoulder, not elsewhere classified: Secondary | ICD-10-CM | POA: Diagnosis not present

## 2019-07-08 DIAGNOSIS — Z471 Aftercare following joint replacement surgery: Secondary | ICD-10-CM | POA: Diagnosis not present

## 2019-07-08 DIAGNOSIS — M6281 Muscle weakness (generalized): Secondary | ICD-10-CM | POA: Diagnosis not present

## 2019-07-08 DIAGNOSIS — Z96611 Presence of right artificial shoulder joint: Secondary | ICD-10-CM | POA: Diagnosis not present

## 2019-07-15 DIAGNOSIS — M25611 Stiffness of right shoulder, not elsewhere classified: Secondary | ICD-10-CM | POA: Diagnosis not present

## 2019-07-15 DIAGNOSIS — Z96611 Presence of right artificial shoulder joint: Secondary | ICD-10-CM | POA: Diagnosis not present

## 2019-07-15 DIAGNOSIS — M6281 Muscle weakness (generalized): Secondary | ICD-10-CM | POA: Diagnosis not present

## 2019-07-17 DIAGNOSIS — M25611 Stiffness of right shoulder, not elsewhere classified: Secondary | ICD-10-CM | POA: Diagnosis not present

## 2019-07-17 DIAGNOSIS — Z96611 Presence of right artificial shoulder joint: Secondary | ICD-10-CM | POA: Diagnosis not present

## 2019-07-17 DIAGNOSIS — M6281 Muscle weakness (generalized): Secondary | ICD-10-CM | POA: Diagnosis not present

## 2019-07-22 DIAGNOSIS — Z96611 Presence of right artificial shoulder joint: Secondary | ICD-10-CM | POA: Diagnosis not present

## 2019-07-22 DIAGNOSIS — M25611 Stiffness of right shoulder, not elsewhere classified: Secondary | ICD-10-CM | POA: Diagnosis not present

## 2019-07-22 DIAGNOSIS — M6281 Muscle weakness (generalized): Secondary | ICD-10-CM | POA: Diagnosis not present

## 2019-07-28 ENCOUNTER — Other Ambulatory Visit: Payer: Medicare PPO

## 2019-07-29 DIAGNOSIS — Z96611 Presence of right artificial shoulder joint: Secondary | ICD-10-CM | POA: Diagnosis not present

## 2019-07-29 DIAGNOSIS — M6281 Muscle weakness (generalized): Secondary | ICD-10-CM | POA: Diagnosis not present

## 2019-07-29 DIAGNOSIS — M25611 Stiffness of right shoulder, not elsewhere classified: Secondary | ICD-10-CM | POA: Diagnosis not present

## 2019-07-31 DIAGNOSIS — M6281 Muscle weakness (generalized): Secondary | ICD-10-CM | POA: Diagnosis not present

## 2019-07-31 DIAGNOSIS — Z96611 Presence of right artificial shoulder joint: Secondary | ICD-10-CM | POA: Diagnosis not present

## 2019-07-31 DIAGNOSIS — M25611 Stiffness of right shoulder, not elsewhere classified: Secondary | ICD-10-CM | POA: Diagnosis not present

## 2019-08-03 ENCOUNTER — Ambulatory Visit
Admission: RE | Admit: 2019-08-03 | Discharge: 2019-08-03 | Disposition: A | Discharge status: Home / Self Care | Payer: Medicare PPO | Source: Ambulatory Visit | Attending: General Surgery | Admitting: General Surgery

## 2019-08-03 ENCOUNTER — Other Ambulatory Visit: Payer: Self-pay

## 2019-08-03 DIAGNOSIS — R1032 Left lower quadrant pain: Secondary | ICD-10-CM

## 2019-08-03 DIAGNOSIS — K4020 Bilateral inguinal hernia, without obstruction or gangrene, not specified as recurrent: Secondary | ICD-10-CM | POA: Diagnosis not present

## 2019-08-14 DIAGNOSIS — K4001 Bilateral inguinal hernia, with obstruction, without gangrene, recurrent: Secondary | ICD-10-CM | POA: Diagnosis not present

## 2019-08-19 DIAGNOSIS — Z96611 Presence of right artificial shoulder joint: Secondary | ICD-10-CM | POA: Diagnosis not present

## 2019-08-19 DIAGNOSIS — M25611 Stiffness of right shoulder, not elsewhere classified: Secondary | ICD-10-CM | POA: Diagnosis not present

## 2019-08-19 DIAGNOSIS — Z471 Aftercare following joint replacement surgery: Secondary | ICD-10-CM | POA: Diagnosis not present

## 2019-09-11 DIAGNOSIS — N400 Enlarged prostate without lower urinary tract symptoms: Secondary | ICD-10-CM | POA: Diagnosis not present

## 2019-09-11 DIAGNOSIS — I10 Essential (primary) hypertension: Secondary | ICD-10-CM | POA: Diagnosis not present

## 2019-09-11 DIAGNOSIS — E782 Mixed hyperlipidemia: Secondary | ICD-10-CM | POA: Diagnosis not present

## 2019-09-11 DIAGNOSIS — M19012 Primary osteoarthritis, left shoulder: Secondary | ICD-10-CM | POA: Diagnosis not present

## 2019-09-11 DIAGNOSIS — M47816 Spondylosis without myelopathy or radiculopathy, lumbar region: Secondary | ICD-10-CM | POA: Diagnosis not present

## 2019-09-11 DIAGNOSIS — M1990 Unspecified osteoarthritis, unspecified site: Secondary | ICD-10-CM | POA: Diagnosis not present

## 2019-09-18 ENCOUNTER — Ambulatory Visit: Payer: Self-pay | Admitting: General Surgery

## 2019-09-22 DIAGNOSIS — M4185 Other forms of scoliosis, thoracolumbar region: Secondary | ICD-10-CM | POA: Diagnosis not present

## 2019-09-22 DIAGNOSIS — M40202 Unspecified kyphosis, cervical region: Secondary | ICD-10-CM | POA: Diagnosis not present

## 2019-09-22 DIAGNOSIS — R269 Unspecified abnormalities of gait and mobility: Secondary | ICD-10-CM | POA: Diagnosis not present

## 2019-09-22 DIAGNOSIS — M4056 Lordosis, unspecified, lumbar region: Secondary | ICD-10-CM | POA: Diagnosis not present

## 2019-09-22 DIAGNOSIS — M47816 Spondylosis without myelopathy or radiculopathy, lumbar region: Secondary | ICD-10-CM | POA: Diagnosis not present

## 2019-09-22 DIAGNOSIS — M545 Low back pain: Secondary | ICD-10-CM | POA: Diagnosis not present

## 2019-09-22 DIAGNOSIS — Z4789 Encounter for other orthopedic aftercare: Secondary | ICD-10-CM | POA: Diagnosis not present

## 2019-09-22 DIAGNOSIS — R252 Cramp and spasm: Secondary | ICD-10-CM | POA: Diagnosis not present

## 2019-09-22 DIAGNOSIS — M5136 Other intervertebral disc degeneration, lumbar region: Secondary | ICD-10-CM | POA: Diagnosis not present

## 2019-09-22 DIAGNOSIS — Z981 Arthrodesis status: Secondary | ICD-10-CM | POA: Diagnosis not present

## 2019-09-22 DIAGNOSIS — R2689 Other abnormalities of gait and mobility: Secondary | ICD-10-CM | POA: Diagnosis not present

## 2019-10-12 DIAGNOSIS — M542 Cervicalgia: Secondary | ICD-10-CM | POA: Diagnosis not present

## 2019-10-12 DIAGNOSIS — M47816 Spondylosis without myelopathy or radiculopathy, lumbar region: Secondary | ICD-10-CM | POA: Diagnosis not present

## 2019-10-12 DIAGNOSIS — R269 Unspecified abnormalities of gait and mobility: Secondary | ICD-10-CM | POA: Diagnosis not present

## 2019-10-12 DIAGNOSIS — Z981 Arthrodesis status: Secondary | ICD-10-CM | POA: Diagnosis not present

## 2019-10-12 DIAGNOSIS — M48061 Spinal stenosis, lumbar region without neurogenic claudication: Secondary | ICD-10-CM | POA: Diagnosis not present

## 2019-10-12 DIAGNOSIS — M4807 Spinal stenosis, lumbosacral region: Secondary | ICD-10-CM | POA: Diagnosis not present

## 2019-10-21 DIAGNOSIS — M25511 Pain in right shoulder: Secondary | ICD-10-CM | POA: Diagnosis not present

## 2019-10-21 DIAGNOSIS — M25611 Stiffness of right shoulder, not elsewhere classified: Secondary | ICD-10-CM | POA: Diagnosis not present

## 2019-10-26 DIAGNOSIS — M545 Low back pain: Secondary | ICD-10-CM | POA: Diagnosis not present

## 2019-10-26 DIAGNOSIS — Z981 Arthrodesis status: Secondary | ICD-10-CM | POA: Diagnosis not present

## 2019-10-27 ENCOUNTER — Other Ambulatory Visit (HOSPITAL_COMMUNITY)
Admission: RE | Admit: 2019-10-27 | Discharge: 2019-10-27 | Disposition: A | Discharge status: Home / Self Care | Payer: Medicare PPO | Source: Ambulatory Visit | Attending: General Surgery | Admitting: General Surgery

## 2019-10-27 DIAGNOSIS — Z01812 Encounter for preprocedural laboratory examination: Secondary | ICD-10-CM | POA: Diagnosis not present

## 2019-10-27 DIAGNOSIS — Z20822 Contact with and (suspected) exposure to covid-19: Secondary | ICD-10-CM | POA: Insufficient documentation

## 2019-10-27 LAB — SARS CORONAVIRUS 2 (TAT 6-24 HRS): SARS Coronavirus 2: NEGATIVE

## 2019-10-28 ENCOUNTER — Encounter (HOSPITAL_BASED_OUTPATIENT_CLINIC_OR_DEPARTMENT_OTHER): Payer: Self-pay | Admitting: General Surgery

## 2019-10-28 ENCOUNTER — Encounter (HOSPITAL_BASED_OUTPATIENT_CLINIC_OR_DEPARTMENT_OTHER)
Admission: RE | Admit: 2019-10-28 | Discharge: 2019-10-28 | Disposition: A | Discharge status: Home / Self Care | Payer: Medicare PPO | Source: Ambulatory Visit | Attending: General Surgery | Admitting: General Surgery

## 2019-10-28 ENCOUNTER — Other Ambulatory Visit: Payer: Self-pay

## 2019-10-28 DIAGNOSIS — Z01812 Encounter for preprocedural laboratory examination: Secondary | ICD-10-CM | POA: Diagnosis not present

## 2019-10-28 LAB — BASIC METABOLIC PANEL
Anion gap: 9 (ref 5–15)
BUN: 19 mg/dL (ref 8–23)
CO2: 25 mmol/L (ref 22–32)
Calcium: 9.4 mg/dL (ref 8.9–10.3)
Chloride: 105 mmol/L (ref 98–111)
Creatinine, Ser: 1.11 mg/dL (ref 0.61–1.24)
GFR calc Af Amer: >60 mL/min (ref >60)
GFR calc non Af Amer: >60 mL/min (ref >60)
Glucose, Bld: 93 mg/dL (ref 70–99)
Potassium: 4.2 mmol/L (ref 3.5–5.1)
Sodium: 139 mmol/L (ref 135–145)

## 2019-10-28 MED ORDER — CHLORHEXIDINE GLUCONATE CLOTH 2 % EX PADS: 6.0000 | MEDICATED_PAD | Freq: Once | CUTANEOUS | Status: DC

## 2019-10-28 NOTE — Progress Notes (Signed)
      Enhanced Recovery after Surgery for Orthopedics Enhanced Recovery after Surgery is a protocol used to improve the stress on your body and your recovery after surgery.  Patient Instructions  . The night before surgery:  o No food after midnight. ONLY clear liquids after midnight  . The day of surgery (if you do NOT have diabetes):  o Drink ONE (1) Pre-Surgery Clear Ensure as directed.   o This drink was given to you during your hospital  pre-op appointment visit. o The pre-op nurse will instruct you on the time to drink the  Pre-Surgery Ensure depending on your surgery time. o Finish the drink at the designated time by the pre-op nurse.  o Nothing else to drink after completing the  Pre-Surgery Clear Ensure.  . The day of surgery (if you have diabetes): o Drink ONE (1) Gatorade 2 (G2) as directed. o This drink was given to you during your hospital  pre-op appointment visit.  o The pre-op nurse will instruct you on the time to drink the   Gatorade 2 (G2) depending on your surgery time. o Color of the Gatorade may vary. Red is not allowed. o Nothing else to drink after completing the  Gatorade 2 (G2).         If you have questions, please contact your surgeon's office.  Patient given CHG pre-surgical solution. Patient verbalized understanding.

## 2019-10-30 ENCOUNTER — Other Ambulatory Visit: Payer: Self-pay

## 2019-10-30 ENCOUNTER — Ambulatory Visit (HOSPITAL_BASED_OUTPATIENT_CLINIC_OR_DEPARTMENT_OTHER)
Admission: RE | Admit: 2019-10-30 | Discharge: 2019-10-30 | Disposition: A | Discharge status: Home / Self Care | Payer: Medicare PPO | Attending: General Surgery | Admitting: General Surgery

## 2019-10-30 ENCOUNTER — Encounter (HOSPITAL_BASED_OUTPATIENT_CLINIC_OR_DEPARTMENT_OTHER): Payer: Self-pay | Admitting: General Surgery

## 2019-10-30 ENCOUNTER — Encounter (HOSPITAL_BASED_OUTPATIENT_CLINIC_OR_DEPARTMENT_OTHER)
Admission: RE | Disposition: A | Discharge status: Home / Self Care | Payer: Self-pay | Source: Home / Self Care | Attending: General Surgery

## 2019-10-30 ENCOUNTER — Ambulatory Visit (HOSPITAL_BASED_OUTPATIENT_CLINIC_OR_DEPARTMENT_OTHER): Payer: Medicare PPO | Admitting: Anesthesiology

## 2019-10-30 DIAGNOSIS — K4021 Bilateral inguinal hernia, without obstruction or gangrene, recurrent: Secondary | ICD-10-CM | POA: Diagnosis not present

## 2019-10-30 DIAGNOSIS — Z791 Long term (current) use of non-steroidal anti-inflammatories (NSAID): Secondary | ICD-10-CM | POA: Insufficient documentation

## 2019-10-30 DIAGNOSIS — I10 Essential (primary) hypertension: Secondary | ICD-10-CM | POA: Diagnosis not present

## 2019-10-30 DIAGNOSIS — Z79891 Long term (current) use of opiate analgesic: Secondary | ICD-10-CM | POA: Insufficient documentation

## 2019-10-30 DIAGNOSIS — E785 Hyperlipidemia, unspecified: Secondary | ICD-10-CM | POA: Diagnosis not present

## 2019-10-30 DIAGNOSIS — Z9119 Patient's noncompliance with other medical treatment and regimen: Secondary | ICD-10-CM | POA: Diagnosis not present

## 2019-10-30 DIAGNOSIS — Z79899 Other long term (current) drug therapy: Secondary | ICD-10-CM | POA: Insufficient documentation

## 2019-10-30 DIAGNOSIS — Z7982 Long term (current) use of aspirin: Secondary | ICD-10-CM | POA: Diagnosis not present

## 2019-10-30 DIAGNOSIS — K219 Gastro-esophageal reflux disease without esophagitis: Secondary | ICD-10-CM | POA: Insufficient documentation

## 2019-10-30 DIAGNOSIS — M1990 Unspecified osteoarthritis, unspecified site: Secondary | ICD-10-CM | POA: Insufficient documentation

## 2019-10-30 DIAGNOSIS — G8918 Other acute postprocedural pain: Secondary | ICD-10-CM | POA: Diagnosis not present

## 2019-10-30 DIAGNOSIS — G4730 Sleep apnea, unspecified: Secondary | ICD-10-CM | POA: Insufficient documentation

## 2019-10-30 HISTORY — PX: INGUINAL HERNIA REPAIR: SHX194

## 2019-10-30 SURGERY — REPAIR, HERNIA, INGUINAL, BILATERAL, ADULT
Anesthesia: General | Site: Groin | Laterality: Bilateral

## 2019-10-30 MED ORDER — FENTANYL CITRATE (PF) 100 MCG/2ML IJ SOLN
100.0000 ug | Freq: Once | INTRAMUSCULAR | Status: AC
  Administered 2019-10-30: 100 ug via INTRAVENOUS

## 2019-10-30 MED ORDER — ONDANSETRON HCL 4 MG/2ML IJ SOLN
INTRAMUSCULAR | Status: AC
  Filled 2019-10-30: qty 8

## 2019-10-30 MED ORDER — HYDROMORPHONE HCL 1 MG/ML IJ SOLN: 0.2500 mg | INTRAMUSCULAR | Status: DC | PRN

## 2019-10-30 MED ORDER — PROPOFOL 10 MG/ML IV BOLUS
INTRAVENOUS | Status: AC
  Filled 2019-10-30: qty 20

## 2019-10-30 MED ORDER — HYDROCODONE-ACETAMINOPHEN 5-325 MG PO TABS: 1.0000 | ORAL_TABLET | Freq: Four times a day (QID) | ORAL | 0 refills | Status: AC | PRN

## 2019-10-30 MED ORDER — FENTANYL CITRATE (PF) 100 MCG/2ML IJ SOLN
INTRAMUSCULAR | Status: DC | PRN
Start: 2019-10-30 — End: 2019-10-30
  Administered 2019-10-30 (×2): 25 ug via INTRAVENOUS
  Administered 2019-10-30: 50 ug via INTRAVENOUS

## 2019-10-30 MED ORDER — DEXAMETHASONE SODIUM PHOSPHATE 10 MG/ML IJ SOLN
INTRAMUSCULAR | Status: DC | PRN
  Administered 2019-10-30: 10 mg via INTRAVENOUS

## 2019-10-30 MED ORDER — OXYCODONE HCL 5 MG PO TABS
5.0000 mg | ORAL_TABLET | Freq: Once | ORAL | Status: AC | PRN
  Administered 2019-10-30: 5 mg via ORAL

## 2019-10-30 MED ORDER — PROPOFOL 10 MG/ML IV BOLUS
INTRAVENOUS | Status: DC | PRN
  Administered 2019-10-30: 160 mg via INTRAVENOUS

## 2019-10-30 MED ORDER — PHENYLEPHRINE HCL (PRESSORS) 10 MG/ML IV SOLN
INTRAVENOUS | Status: DC | PRN
  Administered 2019-10-30 (×2): 40 ug via INTRAVENOUS

## 2019-10-30 MED ORDER — ONDANSETRON HCL 4 MG/2ML IJ SOLN
INTRAMUSCULAR | Status: DC | PRN
  Administered 2019-10-30: 4 mg via INTRAVENOUS

## 2019-10-30 MED ORDER — SCOPOLAMINE 1 MG/3DAYS TD PT72
MEDICATED_PATCH | TRANSDERMAL | Status: AC
  Filled 2019-10-30: qty 1

## 2019-10-30 MED ORDER — BUPIVACAINE HCL (PF) 0.25 % IJ SOLN
INTRAMUSCULAR | Status: DC | PRN
  Administered 2019-10-30: 10 mL

## 2019-10-30 MED ORDER — ACETAMINOPHEN 500 MG PO TABS
ORAL_TABLET | ORAL | Status: AC
  Filled 2019-10-30: qty 2

## 2019-10-30 MED ORDER — LIDOCAINE 2% (20 MG/ML) 5 ML SYRINGE
INTRAMUSCULAR | Status: AC
  Filled 2019-10-30: qty 15

## 2019-10-30 MED ORDER — EPHEDRINE SULFATE 50 MG/ML IJ SOLN
INTRAMUSCULAR | Status: DC | PRN
  Administered 2019-10-30: 5 mg via INTRAVENOUS

## 2019-10-30 MED ORDER — BUPIVACAINE-EPINEPHRINE (PF) 0.25% -1:200000 IJ SOLN
INTRAMUSCULAR | Status: DC | PRN
  Administered 2019-10-30 (×2): 30 mL

## 2019-10-30 MED ORDER — ONDANSETRON HCL 4 MG/2ML IJ SOLN: 4.0000 mg | Freq: Once | INTRAMUSCULAR | Status: DC | PRN

## 2019-10-30 MED ORDER — CELECOXIB 200 MG PO CAPS
ORAL_CAPSULE | ORAL | Status: AC
  Filled 2019-10-30: qty 1

## 2019-10-30 MED ORDER — FENTANYL CITRATE (PF) 100 MCG/2ML IJ SOLN
INTRAMUSCULAR | Status: AC
  Filled 2019-10-30: qty 2

## 2019-10-30 MED ORDER — ACETAMINOPHEN 500 MG PO TABS
1000.0000 mg | ORAL_TABLET | ORAL | Status: AC
  Administered 2019-10-30: 1000 mg via ORAL

## 2019-10-30 MED ORDER — CELECOXIB 200 MG PO CAPS
200.0000 mg | ORAL_CAPSULE | ORAL | Status: AC
  Administered 2019-10-30: 200 mg via ORAL

## 2019-10-30 MED ORDER — LIDOCAINE 2% (20 MG/ML) 5 ML SYRINGE
INTRAMUSCULAR | Status: DC | PRN
  Administered 2019-10-30: 100 mg via INTRAVENOUS

## 2019-10-30 MED ORDER — CLONIDINE HCL (ANALGESIA) 100 MCG/ML EP SOLN
EPIDURAL | Status: DC | PRN
  Administered 2019-10-30 (×2): 50 ug

## 2019-10-30 MED ORDER — LACTATED RINGERS IV SOLN: INTRAVENOUS | Status: DC

## 2019-10-30 MED ORDER — ROCURONIUM BROMIDE 100 MG/10ML IV SOLN
INTRAVENOUS | Status: DC | PRN
  Administered 2019-10-30: 20 mg via INTRAVENOUS
  Administered 2019-10-30: 50 mg via INTRAVENOUS

## 2019-10-30 MED ORDER — MIDAZOLAM HCL 2 MG/2ML IJ SOLN: 2.0000 mg | Freq: Once | INTRAMUSCULAR | Status: DC

## 2019-10-30 MED ORDER — GABAPENTIN 300 MG PO CAPS
300.0000 mg | ORAL_CAPSULE | ORAL | Status: AC
  Administered 2019-10-30: 300 mg via ORAL

## 2019-10-30 MED ORDER — DEXAMETHASONE SODIUM PHOSPHATE 4 MG/ML IJ SOLN
INTRAMUSCULAR | Status: DC | PRN
  Administered 2019-10-30 (×2): 5 mg

## 2019-10-30 MED ORDER — DEXAMETHASONE SODIUM PHOSPHATE 10 MG/ML IJ SOLN
INTRAMUSCULAR | Status: AC
  Filled 2019-10-30: qty 2

## 2019-10-30 MED ORDER — CEFAZOLIN SODIUM-DEXTROSE 2-4 GM/100ML-% IV SOLN
INTRAVENOUS | Status: AC
  Filled 2019-10-30: qty 100

## 2019-10-30 MED ORDER — OXYCODONE HCL 5 MG/5ML PO SOLN: 5.0000 mg | Freq: Once | ORAL | Status: AC | PRN

## 2019-10-30 MED ORDER — CEFAZOLIN SODIUM-DEXTROSE 2-4 GM/100ML-% IV SOLN
2.0000 g | INTRAVENOUS | Status: AC
  Administered 2019-10-30: 2 g via INTRAVENOUS

## 2019-10-30 MED ORDER — OXYCODONE HCL 5 MG PO TABS
ORAL_TABLET | ORAL | Status: AC
  Filled 2019-10-30: qty 1

## 2019-10-30 MED ORDER — PHENYLEPHRINE 40 MCG/ML (10ML) SYRINGE FOR IV PUSH (FOR BLOOD PRESSURE SUPPORT)
PREFILLED_SYRINGE | INTRAVENOUS | Status: AC
  Filled 2019-10-30: qty 10

## 2019-10-30 MED ORDER — ROCURONIUM BROMIDE 10 MG/ML (PF) SYRINGE
PREFILLED_SYRINGE | INTRAVENOUS | Status: AC
  Filled 2019-10-30: qty 30

## 2019-10-30 MED ORDER — MIDAZOLAM HCL 2 MG/2ML IJ SOLN
INTRAMUSCULAR | Status: AC
  Filled 2019-10-30: qty 2

## 2019-10-30 MED ORDER — SUGAMMADEX SODIUM 200 MG/2ML IV SOLN
INTRAVENOUS | Status: DC | PRN
  Administered 2019-10-30: 200 mg via INTRAVENOUS

## 2019-10-30 MED ORDER — GABAPENTIN 300 MG PO CAPS
ORAL_CAPSULE | ORAL | Status: AC
  Filled 2019-10-30: qty 1

## 2019-10-30 SURGICAL SUPPLY — 52 items
ADH SKN CLS APL DERMABOND .7 (GAUZE/BANDAGES/DRESSINGS) ×1
APL PRP STRL LF DISP 70% ISPRP (MISCELLANEOUS) ×1
APL SKNCLS STERI-STRIP NONHPOA (GAUZE/BANDAGES/DRESSINGS)
BENZOIN TINCTURE PRP APPL 2/3 (GAUZE/BANDAGES/DRESSINGS) IMPLANT
BLADE CLIPPER SURG (BLADE) ×2 IMPLANT
BLADE HEX COATED 2.75 (ELECTRODE) ×1 IMPLANT
BLADE SURG 15 STRL LF DISP TIS (BLADE) ×1 IMPLANT
BLADE SURG 15 STRL SS (BLADE) ×3
CHLORAPREP W/TINT 26 (MISCELLANEOUS) ×3 IMPLANT
CLOSURE WOUND 1/2 X4 (GAUZE/BANDAGES/DRESSINGS)
COVER BACK TABLE 60X90IN (DRAPES) ×3 IMPLANT
COVER MAYO STAND STRL (DRAPES) ×3 IMPLANT
COVER WAND RF STERILE (DRAPES) IMPLANT
DECANTER SPIKE VIAL GLASS SM (MISCELLANEOUS) IMPLANT
DERMABOND ADVANCED (GAUZE/BANDAGES/DRESSINGS) ×2
DERMABOND ADVANCED .7 DNX12 (GAUZE/BANDAGES/DRESSINGS) ×1 IMPLANT
DRAIN PENROSE 1/2X12 LTX STRL (WOUND CARE) ×3 IMPLANT
DRAPE LAPAROTOMY TRNSV 102X78 (DRAPES) ×3 IMPLANT
DRAPE UTILITY XL STRL (DRAPES) ×3 IMPLANT
ELECT REM PT RETURN 9FT ADLT (ELECTROSURGICAL) ×3
ELECTRODE REM PT RTRN 9FT ADLT (ELECTROSURGICAL) ×1 IMPLANT
GLOVE BIO SURGEON STRL SZ 6.5 (GLOVE) ×1 IMPLANT
GLOVE BIO SURGEON STRL SZ7.5 (GLOVE) ×3 IMPLANT
GLOVE BIO SURGEONS STRL SZ 6.5 (GLOVE) ×1
GLOVE BIOGEL PI IND STRL 7.0 (GLOVE) IMPLANT
GLOVE BIOGEL PI INDICATOR 7.0 (GLOVE) ×4
GLOVE ECLIPSE 6.5 STRL STRAW (GLOVE) ×2 IMPLANT
GOWN STRL REUS W/ TWL LRG LVL3 (GOWN DISPOSABLE) ×2 IMPLANT
GOWN STRL REUS W/TWL LRG LVL3 (GOWN DISPOSABLE) ×15
MESH ULTRAPRO 3X6 7.6X15CM (Mesh General) ×4 IMPLANT
NDL HYPO 25X1 1.5 SAFETY (NEEDLE) ×1 IMPLANT
NEEDLE HYPO 22GX1.5 SAFETY (NEEDLE) IMPLANT
NEEDLE HYPO 25X1 1.5 SAFETY (NEEDLE) ×3 IMPLANT
NS IRRIG 1000ML POUR BTL (IV SOLUTION) ×3 IMPLANT
PACK BASIN DAY SURGERY FS (CUSTOM PROCEDURE TRAY) ×3 IMPLANT
PENCIL SMOKE EVACUATOR (MISCELLANEOUS) ×5 IMPLANT
SLEEVE SCD COMPRESS KNEE MED (MISCELLANEOUS) ×3 IMPLANT
SPONGE LAP 18X18 RF (DISPOSABLE) ×3 IMPLANT
STRIP CLOSURE SKIN 1/2X4 (GAUZE/BANDAGES/DRESSINGS) IMPLANT
SUT MON AB 4-0 PC3 18 (SUTURE) ×5 IMPLANT
SUT PROLENE 2 0 SH DA (SUTURE) ×16 IMPLANT
SUT SILK 2 0 SH (SUTURE) ×5 IMPLANT
SUT SILK 3 0 SH 30 (SUTURE) IMPLANT
SUT SILK 3 0 TIES 17X18 (SUTURE) ×6
SUT SILK 3-0 18XBRD TIE BLK (SUTURE) ×1 IMPLANT
SUT VIC AB 0 SH 27 (SUTURE) IMPLANT
SUT VIC AB 2-0 SH 27 (SUTURE) ×12
SUT VIC AB 2-0 SH 27XBRD (SUTURE) ×1 IMPLANT
SUT VIC AB 3-0 SH 27 (SUTURE) ×9
SUT VIC AB 3-0 SH 27X BRD (SUTURE) ×1 IMPLANT
SYR CONTROL 10ML LL (SYRINGE) ×3 IMPLANT
TOWEL GREEN STERILE FF (TOWEL DISPOSABLE) ×3 IMPLANT

## 2019-10-30 NOTE — Anesthesia Preprocedure Evaluation (Addendum)
Anesthesia Evaluation  Patient identified by MRN, date of birth, ID band Patient awake and Patient confused    Reviewed: Allergy & Precautions, NPO status , Patient's Chart, lab work & pertinent test results, reviewed documented beta blocker date and time   History of Anesthesia Complications Negative for: history of anesthetic complications  Airway Mallampati: II  TM Distance: >3 FB Neck ROM: Full    Dental no notable dental hx. (+) Dental Advisory Given   Pulmonary sleep apnea , pneumonia,  Non compliant with CPAP   Pulmonary exam normal        Cardiovascular hypertension, Pt. on medications and Pt. on home beta blockers Normal cardiovascular exam Rhythm:Regular     Neuro/Psych  Headaches, HOH bilateral  Neuromuscular disease negative psych ROS   GI/Hepatic Neg liver ROS, GERD  Medicated and Controlled,  Endo/Other  Hyperlipidemia  Renal/GU negative Renal ROS  negative genitourinary   Musculoskeletal  (+) Arthritis , Osteoarthritis,  Chondromalacia right shoulder   Abdominal   Peds  Hematology negative hematology ROS (+)   Anesthesia Other Findings   Reproductive/Obstetrics                            Anesthesia Physical  Anesthesia Plan  ASA: II  Anesthesia Plan: General   Post-op Pain Management: GA combined w/ Regional for post-op pain   Induction: Intravenous  PONV Risk Score and Plan: 3 and Ondansetron, Treatment may vary due to age or medical condition and Dexamethasone  Airway Management Planned: Oral ETT  Additional Equipment: None  Intra-op Plan:   Post-operative Plan: Extubation in OR  Informed Consent: I have reviewed the patients History and Physical, chart, labs and discussed the procedure including the risks, benefits and alternatives for the proposed anesthesia with the patient or authorized representative who has indicated his/her understanding and  acceptance.     Dental advisory given  Plan Discussed with: CRNA  Anesthesia Plan Comments:        Anesthesia Quick Evaluation

## 2019-10-30 NOTE — Anesthesia Procedure Notes (Signed)
Anesthesia Regional Block: TAP block   Pre-Anesthetic Checklist: ,, timeout performed, Correct Patient, Correct Site, Correct Laterality, Correct Procedure, Correct Position, site marked, Risks and benefits discussed,  Surgical consent,  Pre-op evaluation,  At surgeon's request and post-op pain management  Laterality: Left and Right  Prep: chloraprep       Needles:   Needle Type: Stimiplex     Needle Length: 9cm      Additional Needles:   Procedures:,,,, ultrasound used (permanent image in chart),,,,  Narrative:  Start time: 10/30/2019 10:14 AM End time: 10/30/2019 10:22 AM Injection made incrementally with aspirations every 5 mL.  Performed by: Personally  Anesthesiologist: Nolon Nations, MD  Additional Notes: Patient tolerated well. Good fascial spread noted.

## 2019-10-30 NOTE — Anesthesia Postprocedure Evaluation (Signed)
Anesthesia Post Note  Patient: Travis Arnold  Procedure(s) Performed: BILATERAL INGUINAL HERNIA REPAIR WITH MESH (Bilateral Groin)     Patient location during evaluation: PACU Anesthesia Type: General Level of consciousness: sedated and patient cooperative Pain management: pain level controlled Vital Signs Assessment: post-procedure vital signs reviewed and stable Respiratory status: spontaneous breathing Cardiovascular status: stable Anesthetic complications: no   No complications documented.  Last Vitals:  Vitals:   10/30/19 1330 10/30/19 1338  BP: 140/87 136/84  Pulse: 67 70  Resp: 12 18  Temp:    SpO2: 94% 94%    Last Pain:  Vitals:   10/30/19 1330  TempSrc:   PainSc: [place]

## 2019-10-30 NOTE — Discharge Instructions (Signed)
  Post Anesthesia Home Care Instructions  Activity: Get plenty of rest for the remainder of the day. A responsible individual must stay with you for 24 hours following the procedure.  For the next 24 hours, DO NOT: -Drive a car -Paediatric nurse -Drink alcoholic beverages -Take any medication unless instructed by your physician -Make any legal decisions or sign important papers.  Meals: Start with liquid foods such as gelatin or soup. Progress to regular foods as tolerated. Avoid greasy, spicy, heavy foods. If nausea and/or vomiting occur, drink only clear liquids until the nausea and/or vomiting subsides. Call your physician if vomiting continues.  Special Instructions/Symptoms: Your throat may feel dry or sore from the anesthesia or the breathing tube placed in your throat during surgery. If this causes discomfort, gargle with warm salt water. The discomfort should disappear within 24 hours.  If you had a scopolamine patch placed behind your ear for the management of post- operative nausea and/or vomiting:  1. The medication in the patch is effective for 72 hours, after which it should be removed.  Wrap patch in a tissue and discard in the trash. Wash hands thoroughly with soap and water. 2. You may remove the patch earlier than 72 hours if you experience unpleasant side effects which may include dry mouth, dizziness or visual disturbances. 3. Avoid touching the patch. Wash your hands with soap and water after contact with the patch.     May take Tylenol after 4pm, if needed May take Ibuprofen after 4pm, if needed

## 2019-10-30 NOTE — H&P (Signed)
Travis Arnold  Location: [facility] Patient #: [id number] DOB: [birth year] Married / Language: English / Race: White Male   History of Present Illness  The patient is a 69 year old male who presents for a follow-up for Abdominal pain. The patient is a 69 year old white male who is been having left groin pain for about 2 years. He states that he may have had multiple hernia repairs in the past but does not recall them ever using mesh. Because his exam was equivocal I had him undergo a CT scan of the pelvis. This confirmed that he has bilateral inguinal hernias, larger on the left. He returns today to discuss surgery to have the hernias fixed.   Allergies  No Known Drug Allergies   Medication History Atorvastatin Calcium (40MG  Tablet, Oral) Active. Esomeprazole Magnesium (40MG  Capsule DR, Oral) Active. Metoprolol Tartrate (50MG  Tablet, Oral) Active. Lisinopril (10MG  Tablet, Oral) Active. Soma (350MG  Tablet, Oral) Active. hydrOXYzine HCl (10MG  Tablet, Oral) Active. Sildenafil Citrate (50MG  Tablet, Oral) Active. Probiotic (Oral) Specific strength unknown - Active. PreserVision AREDS (Oral) Active. oxyCODONE HCl (5MG  Tablet, Oral) Active. Multivitamin (Oral) Active. Meclizine HCl (25MG  Tablet, Oral) Active. Finasteride (5MG  Tablet, Oral) Active. Doxazosin Mesylate (4MG  Tablet, Oral) Active. Baclofen (10MG  Tablet, Oral) Active. Aspirin (81MG  Tablet, Oral) Active. Amitiza (24MCG Capsule, Oral) Active. traMADol HCl (50MG  Tablet, Oral) Active. Meloxicam (15MG  Tablet, Oral) Active. Medications Reconciled    Review of Systems  General Not Present- Appetite Loss, Chills, Fatigue, Fever, Night Sweats, Weight Gain and Weight Loss. Skin Not Present- Change in Wart/Mole, Dryness, Hives, Jaundice, New Lesions, Non-Healing Wounds, Rash and Ulcer. HEENT Present- Hearing Loss and Ringing in the Ears. Not Present- Earache, Hoarseness, Nose Bleed, Oral  Ulcers, Seasonal Allergies, Sinus Pain, Sore Throat, Visual Disturbances, Wears glasses/contact lenses and Yellow Eyes. Respiratory Not Present- Bloody sputum, Chronic Cough, Difficulty Breathing, Snoring and Wheezing. Breast Not Present- Breast Mass, Breast Pain, Nipple Discharge and Skin Changes. Cardiovascular Not Present- Chest Pain, Difficulty Breathing Lying Down, Leg Cramps, Palpitations, Rapid Heart Rate, Shortness of Breath and Swelling of Extremities. Male Genitourinary Not Present- Blood in Urine, Change in Urinary Stream, Frequency, Impotence, Nocturia, Painful Urination, Urgency and Urine Leakage. Musculoskeletal Present- Back Pain, Joint Pain, Joint Stiffness and Muscle Pain. Not Present- Muscle Weakness and Swelling of Extremities. Neurological Present- Headaches. Not Present- Decreased Memory, Fainting, Numbness, Seizures, Tingling, Tremor, Trouble walking and Weakness. Psychiatric Not Present- Anxiety, Bipolar, Change in Sleep Pattern, Depression, Fearful and Frequent crying. Endocrine Present- Cold Intolerance and Heat Intolerance. Not Present- Excessive Hunger, Hair Changes and New Diabetes. Hematology Present- Blood Thinners. Not Present- Easy Bruising, Excessive bleeding, Gland problems, HIV and Persistent Infections.  Vitals  Weight: 178.5 lb Height: 71in Body Surface Area: 2.01 m Body Mass Index: 24.9 kg/m  Temp.: 97.45F  Pulse: 91 (Regular)  BP: 120/80(Sitting, Left Arm, Standard)       Physical Exam  General Mental Status-Alert. General Appearance-Consistent with stated age. Hydration-Well hydrated. Voice-Normal.  Head and Neck Head-normocephalic, atraumatic with no lesions or palpable masses. Trachea-midline. Thyroid Gland Characteristics - normal size and consistency.  Eye Eyeball - Bilateral-Extraocular movements intact. Sclera/Conjunctiva - Bilateral-No scleral icterus.  Chest and Lung Exam Chest and lung exam reveals  -quiet, even and easy respiratory effort with no use of accessory muscles and on auscultation, normal breath sounds, no adventitious sounds and normal vocal resonance. Inspection Chest Wall - Normal. Back - normal.  Cardiovascular Cardiovascular examination reveals -normal heart sounds, regular rate and rhythm with no murmurs and  normal pedal pulses bilaterally.  Abdomen Inspection Inspection of the abdomen reveals - No Hernias. Skin - Scar - no surgical scars. Palpation/Percussion Palpation and Percussion of the abdomen reveal - Soft, Non Tender, No Rebound tenderness, No Rigidity (guarding) and No hepatosplenomegaly. Auscultation Auscultation of the abdomen reveals - Bowel sounds normal.  Male Genitourinary Note: There seems to be a palpable fullness that does not reduce under the lateral portion of the old left groin incision. There is no palpable bulge or impulse was straining in the right groin   Neurologic Neurologic evaluation reveals -alert and oriented x 3 with no impairment of recent or remote memory. Mental Status-Normal.  Musculoskeletal Normal Exam - Left-Upper Extremity Strength Normal and Lower Extremity Strength Normal. Normal Exam - Right-Upper Extremity Strength Normal and Lower Extremity Strength Normal.  Lymphatic Head & Neck  General Head & Neck Lymphatics: Bilateral - Description - Normal. Axillary  General Axillary Region: Bilateral - Description - Normal. Tenderness - Non Tender. Femoral & Inguinal  Generalized Femoral & Inguinal Lymphatics: Bilateral - Description - Normal. Tenderness - Non Tender.    Assessment & Plan  BILATERAL INGUINAL HERNIA, W OBST, W/O GANGRENE, RECURRENT (K40.01) Impression: The patient appears to have bilateral inguinal hernias that are symptomatic. Because of the risk of incarceration and strangulation think he would benefit from having the hernias fixed. He would also like to have this done. I have discussed  with him in detail the risks and benefits of the operation as well as some of the technical aspects and he understands and wishes to proceed. This patient encounter took 20 minutes today to perform the following: take history, perform exam, review outside records, interpret imaging, counsel the patient on their diagnosis and document encounter, findings & plan in the EHR

## 2019-10-30 NOTE — Interval H&P Note (Signed)
History and Physical Interval Note:  10/30/2019 10:34 AM  Travis Arnold  has presented today for surgery, with the diagnosis of bilateral inguinal hernia.  The various methods of treatment have been discussed with the patient and family. After consideration of risks, benefits and other options for treatment, the patient has consented to  Procedure(s): BILATERAL INGUINAL HERNIA REPAIR WITH MESH (Bilateral) as a surgical intervention.  The patient's history has been reviewed, patient examined, no change in status, stable for surgery.  I have reviewed the patient's chart and labs.  Questions were answered to the patient's satisfaction.     Autumn Messing III

## 2019-10-30 NOTE — Progress Notes (Signed)
Assisted Dr. Lissa Hoard with right, left, ultrasound guided, transabdominal plane block. Side rails up, monitors on throughout procedure. See vital signs in flow sheet. Tolerated Procedure well.

## 2019-10-30 NOTE — Transfer of Care (Signed)
Immediate Anesthesia Transfer of Care Note  Patient: Travis Arnold  Procedure(s) Performed: BILATERAL INGUINAL HERNIA REPAIR WITH MESH (Bilateral Groin)  Patient Location: PACU  Anesthesia Type:GA combined with regional for post-op pain  Level of Consciousness: drowsy  Airway & Oxygen Therapy: Patient Spontanous Breathing and Patient connected to face mask oxygen  Post-op Assessment: Report given to RN and Post -op Vital signs reviewed and stable  Post vital signs: Reviewed and stable  Last Vitals:  Vitals Value Taken Time  BP 147/82 10/30/19 1308  Temp    Pulse 73 10/30/19 1310  Resp 17 10/30/19 1310  SpO2 94 % 10/30/19 1310  Vitals shown include unvalidated device data.  Last Pain:  Vitals:   10/30/19 1025  TempSrc:   PainSc: 0-No pain         Complications: No complications documented.

## 2019-10-30 NOTE — Anesthesia Procedure Notes (Signed)
Procedure Name: Intubation Date/Time: 10/30/2019 11:05 AM Performed by: Lavonia Dana, CRNA Pre-anesthesia Checklist: Patient identified, Emergency Drugs available, Suction available and Patient being monitored Patient Re-evaluated:Patient Re-evaluated prior to induction Oxygen Delivery Method: Circle system utilized Preoxygenation: Pre-oxygenation with 100% oxygen Induction Type: IV induction Ventilation: Mask ventilation without difficulty and Oral airway inserted - appropriate to patient size Laryngoscope Size: Mac and 4 Grade View: Grade I Tube type: Oral Tube size: 7.5 mm Number of attempts: 1 Airway Equipment and Method: Stylet,  Bite block and Oral airway Placement Confirmation: ETT inserted through vocal cords under direct vision,  positive ETCO2 and breath sounds checked- equal and bilateral Secured at: 22 cm Tube secured with: Tape Dental Injury: Teeth and Oropharynx as per pre-operative assessment

## 2019-10-30 NOTE — Op Note (Signed)
10/30/2019  12:56 PM  PATIENT:  Travis Arnold  69 y.o. male  PRE-OPERATIVE DIAGNOSIS:  bilateral inguinal hernia recurrent  POST-OPERATIVE DIAGNOSIS:  bilateral inguinal hernia recurrent  PROCEDURE:  Procedure(s): BILATERAL INGUINAL HERNIA REPAIR WITH MESH (Bilateral)  SURGEON:  Surgeon(s) and Role:    * Jovita Kussmaul, MD - Primary  PHYSICIAN ASSISTANT:   ASSISTANTS: Dr. Deborra Medina   ANESTHESIA:   local and general  EBL:  10 mL   BLOOD ADMINISTERED:none  DRAINS: none   LOCAL MEDICATIONS USED:  MARCAINE     SPECIMEN:  No Specimen  DISPOSITION OF SPECIMEN:  N/A  COUNTS:  YES  TOURNIQUET:  * No tourniquets in log *  DICTATION: .Dragon Dictation   After informed consent was obtained the patient was brought to the operating room and placed in the supine position on the operating table. After adequate induction of general anesthesia the patient's abdomen and bilateral groin were prepped with ChloraPrep, allowed to dry, and draped in usual sterile manner. An appropriate timeout was performed. Attention was first turned to the right groin. The groin was infiltrated with quarter percent Marcaine. A small incision was made from the edge of the pubic tubercle towards the anterior superior iliac spine through his previous incision. The incision was carried through the skin and subcutaneous tissue sharply with the electrocautery until we identified what appeared to be the external oblique fascia. This was opened along its fibers towards the apex of the external ring with the electrocautery. These layers were very scarred and from previous surgery in these areas. The cord structures were gently dissected out by blunt hemostat dissection. There did not appear to be a hernia associated with the cord. There did appear to be a small hernia coming through a defect in the floor of the canal laterally. I believe this was the hernia itself. At this point the hernia defect was closed with a  figure-of-eight 2-0 Prolene stitch. A 3 x 6 piece of ultra Pro mesh was chosen and cut to the appropriate size. The mesh was sewed inferiorly to the shelving edge of the inguinal ligament with a running 2-0 Prolene stitch. Superiorly the mesh was sewed to the musculoaponeurotic strength layer of the transversalis with interrupted 2-0 Prolene vertical mattress stitches. Lateral to the cord the tails were cut in the mesh and the tails were anchored to the shelving edge of the inguinal ligament as well with interrupted 2-0 Prolene stitches. Once this was accomplished the hernia seem to be well repaired and the mesh was in good position. The wound was irrigated with saline. The layers were very scarred in and difficult to identify. There did not appear to be a subcutaneous fascia but only one fascial layer to close and this was closed with a running 2-0 Vicryl stitch. The skin was then closed with a running 4-0 Monocryl subcuticular stitch. Attention was then turned to the left groin. Again the groin was infiltrated with quarter percent Marcaine. A small incision was made from the edge of the pubic tubercle towards the anterior superior iliac spine through his previous incision. The incision was carried through the skin and subcutaneous tissue sharply with the electrocautery until what appeared to be the external oblique fascia was identified. Again the layers were very scarred in and had to be dissected out by tedious blunt hemostat dissection so that we could identify the cord and the cord structures. Again there appeared to be a hernia coming through a defect in the floor laterally.  The hernia was reduced without difficulty and the floor of the canal was repaired with a 2-0 Prolene figure-of-eight stitch. Next a 3 x 6 piece of ultra Pro mesh was chosen and cut to the appropriate size. The mesh was sewed inferiorly to the shelving edge of the inguinal ligament with a running 2-0 Prolene stitch. Superiorly the mesh was  sewed to the musculoaponeurotic strength layer of the transversalis with interrupted 2-0 Prolene vertical mattress stitches. Tails were cut in the mesh laterally and the tails were wrapped around the cord structures. The tails of the mesh were anchored to the shelving edge of the inguinal ligament with interrupted 2-0 Prolene stitches. Once this was accomplished the mesh appeared to be in good position and the hernia seem well repaired. The wound was irrigated with saline. The external oblique fascial layer was then closed with a running 2-0 Vicryl stitch. The skin was then closed with a running 4-0 Monocryl subcuticular stitch. Dermabond dressings were then applied. The patient tolerated the procedure well. At the end of the case all needle sponge and instrument counts were correct. The patient's testicles were in the scrotum at the end of the case.  PLAN OF CARE: Discharge to home after PACU  PATIENT DISPOSITION:  PACU - hemodynamically stable.   Delay start of Pharmacological VTE agent (>24hrs) due to surgical blood loss or risk of bleeding: not applicable

## 2019-11-02 ENCOUNTER — Encounter (HOSPITAL_BASED_OUTPATIENT_CLINIC_OR_DEPARTMENT_OTHER): Payer: Self-pay | Admitting: General Surgery

## 2019-11-05 NOTE — Addendum Note (Signed)
Addendum  created 11/05/19 1010 by Angee Gupton, Ernesta Amble, CRNA   Charge Capture section accepted

## 2019-12-01 DIAGNOSIS — R2231 Localized swelling, mass and lump, right upper limb: Secondary | ICD-10-CM | POA: Diagnosis not present

## 2019-12-11 DIAGNOSIS — M71341 Other bursal cyst, right hand: Secondary | ICD-10-CM | POA: Diagnosis not present

## 2019-12-11 DIAGNOSIS — M65351 Trigger finger, right little finger: Secondary | ICD-10-CM | POA: Diagnosis not present

## 2019-12-11 DIAGNOSIS — M67441 Ganglion, right hand: Secondary | ICD-10-CM | POA: Diagnosis not present

## 2019-12-24 DIAGNOSIS — Z86010 Personal history of colonic polyps: Secondary | ICD-10-CM | POA: Diagnosis not present

## 2019-12-24 DIAGNOSIS — Z0000 Encounter for general adult medical examination without abnormal findings: Secondary | ICD-10-CM | POA: Diagnosis not present

## 2019-12-24 DIAGNOSIS — I10 Essential (primary) hypertension: Secondary | ICD-10-CM | POA: Diagnosis not present

## 2019-12-24 DIAGNOSIS — E782 Mixed hyperlipidemia: Secondary | ICD-10-CM | POA: Diagnosis not present

## 2019-12-24 DIAGNOSIS — M47816 Spondylosis without myelopathy or radiculopathy, lumbar region: Secondary | ICD-10-CM | POA: Diagnosis not present

## 2019-12-24 DIAGNOSIS — I700 Atherosclerosis of aorta: Secondary | ICD-10-CM | POA: Diagnosis not present

## 2019-12-24 DIAGNOSIS — R519 Headache, unspecified: Secondary | ICD-10-CM | POA: Diagnosis not present

## 2019-12-24 DIAGNOSIS — N400 Enlarged prostate without lower urinary tract symptoms: Secondary | ICD-10-CM | POA: Diagnosis not present

## 2019-12-24 DIAGNOSIS — G4733 Obstructive sleep apnea (adult) (pediatric): Secondary | ICD-10-CM | POA: Diagnosis not present

## 2019-12-24 DIAGNOSIS — K219 Gastro-esophageal reflux disease without esophagitis: Secondary | ICD-10-CM | POA: Diagnosis not present

## 2019-12-30 DIAGNOSIS — H353111 Nonexudative age-related macular degeneration, right eye, early dry stage: Secondary | ICD-10-CM | POA: Diagnosis not present

## 2019-12-30 DIAGNOSIS — H2511 Age-related nuclear cataract, right eye: Secondary | ICD-10-CM | POA: Diagnosis not present

## 2019-12-30 DIAGNOSIS — H35033 Hypertensive retinopathy, bilateral: Secondary | ICD-10-CM | POA: Diagnosis not present

## 2019-12-30 DIAGNOSIS — H0102A Squamous blepharitis right eye, upper and lower eyelids: Secondary | ICD-10-CM | POA: Diagnosis not present

## 2019-12-30 DIAGNOSIS — H35372 Puckering of macula, left eye: Secondary | ICD-10-CM | POA: Diagnosis not present

## 2019-12-30 DIAGNOSIS — H353122 Nonexudative age-related macular degeneration, left eye, intermediate dry stage: Secondary | ICD-10-CM | POA: Diagnosis not present

## 2019-12-30 DIAGNOSIS — H04123 Dry eye syndrome of bilateral lacrimal glands: Secondary | ICD-10-CM | POA: Diagnosis not present

## 2019-12-30 DIAGNOSIS — H0102B Squamous blepharitis left eye, upper and lower eyelids: Secondary | ICD-10-CM | POA: Diagnosis not present

## 2019-12-30 DIAGNOSIS — H40013 Open angle with borderline findings, low risk, bilateral: Secondary | ICD-10-CM | POA: Diagnosis not present

## 2020-01-14 DIAGNOSIS — H04123 Dry eye syndrome of bilateral lacrimal glands: Secondary | ICD-10-CM | POA: Diagnosis not present

## 2020-01-14 DIAGNOSIS — H40013 Open angle with borderline findings, low risk, bilateral: Secondary | ICD-10-CM | POA: Diagnosis not present

## 2020-01-14 DIAGNOSIS — H0102B Squamous blepharitis left eye, upper and lower eyelids: Secondary | ICD-10-CM | POA: Diagnosis not present

## 2020-01-14 DIAGNOSIS — H35372 Puckering of macula, left eye: Secondary | ICD-10-CM | POA: Diagnosis not present

## 2020-01-14 DIAGNOSIS — H0102A Squamous blepharitis right eye, upper and lower eyelids: Secondary | ICD-10-CM | POA: Diagnosis not present

## 2020-01-14 DIAGNOSIS — H2511 Age-related nuclear cataract, right eye: Secondary | ICD-10-CM | POA: Diagnosis not present

## 2020-01-14 DIAGNOSIS — H1045 Other chronic allergic conjunctivitis: Secondary | ICD-10-CM | POA: Diagnosis not present

## 2020-01-19 DIAGNOSIS — E782 Mixed hyperlipidemia: Secondary | ICD-10-CM | POA: Diagnosis not present

## 2020-01-19 DIAGNOSIS — Z0000 Encounter for general adult medical examination without abnormal findings: Secondary | ICD-10-CM | POA: Diagnosis not present

## 2020-01-19 DIAGNOSIS — I10 Essential (primary) hypertension: Secondary | ICD-10-CM | POA: Diagnosis not present

## 2020-02-24 DIAGNOSIS — R1030 Lower abdominal pain, unspecified: Secondary | ICD-10-CM | POA: Diagnosis not present

## 2020-02-24 DIAGNOSIS — K5900 Constipation, unspecified: Secondary | ICD-10-CM | POA: Diagnosis not present

## 2020-02-24 DIAGNOSIS — Z86010 Personal history of colonic polyps: Secondary | ICD-10-CM | POA: Diagnosis not present

## 2020-03-01 DIAGNOSIS — M1990 Unspecified osteoarthritis, unspecified site: Secondary | ICD-10-CM | POA: Diagnosis not present

## 2020-03-01 DIAGNOSIS — N400 Enlarged prostate without lower urinary tract symptoms: Secondary | ICD-10-CM | POA: Diagnosis not present

## 2020-03-01 DIAGNOSIS — M47816 Spondylosis without myelopathy or radiculopathy, lumbar region: Secondary | ICD-10-CM | POA: Diagnosis not present

## 2020-03-01 DIAGNOSIS — K219 Gastro-esophageal reflux disease without esophagitis: Secondary | ICD-10-CM | POA: Diagnosis not present

## 2020-03-01 DIAGNOSIS — I10 Essential (primary) hypertension: Secondary | ICD-10-CM | POA: Diagnosis not present

## 2020-03-01 DIAGNOSIS — M19012 Primary osteoarthritis, left shoulder: Secondary | ICD-10-CM | POA: Diagnosis not present

## 2020-03-01 DIAGNOSIS — E782 Mixed hyperlipidemia: Secondary | ICD-10-CM | POA: Diagnosis not present

## 2020-03-28 DIAGNOSIS — Z01812 Encounter for preprocedural laboratory examination: Secondary | ICD-10-CM | POA: Diagnosis not present

## 2020-03-31 DIAGNOSIS — K648 Other hemorrhoids: Secondary | ICD-10-CM | POA: Diagnosis not present

## 2020-03-31 DIAGNOSIS — D124 Benign neoplasm of descending colon: Secondary | ICD-10-CM | POA: Diagnosis not present

## 2020-03-31 DIAGNOSIS — Z86010 Personal history of colonic polyps: Secondary | ICD-10-CM | POA: Diagnosis not present

## 2020-03-31 DIAGNOSIS — K5730 Diverticulosis of large intestine without perforation or abscess without bleeding: Secondary | ICD-10-CM | POA: Diagnosis not present

## 2020-04-04 DIAGNOSIS — D124 Benign neoplasm of descending colon: Secondary | ICD-10-CM | POA: Diagnosis not present

## 2020-05-19 DIAGNOSIS — M65342 Trigger finger, left ring finger: Secondary | ICD-10-CM | POA: Diagnosis not present

## 2020-05-20 DIAGNOSIS — Z96611 Presence of right artificial shoulder joint: Secondary | ICD-10-CM | POA: Diagnosis not present

## 2020-05-20 DIAGNOSIS — M19012 Primary osteoarthritis, left shoulder: Secondary | ICD-10-CM | POA: Diagnosis not present

## 2020-05-20 DIAGNOSIS — Z471 Aftercare following joint replacement surgery: Secondary | ICD-10-CM | POA: Diagnosis not present

## 2020-06-13 DIAGNOSIS — I10 Essential (primary) hypertension: Secondary | ICD-10-CM | POA: Diagnosis not present

## 2020-06-13 DIAGNOSIS — K219 Gastro-esophageal reflux disease without esophagitis: Secondary | ICD-10-CM | POA: Diagnosis not present

## 2020-06-13 DIAGNOSIS — M545 Low back pain, unspecified: Secondary | ICD-10-CM | POA: Diagnosis not present

## 2020-06-13 DIAGNOSIS — M47816 Spondylosis without myelopathy or radiculopathy, lumbar region: Secondary | ICD-10-CM | POA: Diagnosis not present

## 2020-06-13 DIAGNOSIS — M5412 Radiculopathy, cervical region: Secondary | ICD-10-CM | POA: Diagnosis not present

## 2020-07-12 DIAGNOSIS — M25562 Pain in left knee: Secondary | ICD-10-CM | POA: Diagnosis not present

## 2020-07-12 DIAGNOSIS — M25561 Pain in right knee: Secondary | ICD-10-CM | POA: Diagnosis not present

## 2020-07-12 DIAGNOSIS — M25552 Pain in left hip: Secondary | ICD-10-CM | POA: Diagnosis not present

## 2020-07-15 DIAGNOSIS — Z471 Aftercare following joint replacement surgery: Secondary | ICD-10-CM | POA: Diagnosis not present

## 2020-07-15 DIAGNOSIS — Z96611 Presence of right artificial shoulder joint: Secondary | ICD-10-CM | POA: Diagnosis not present

## 2020-07-15 DIAGNOSIS — M19022 Primary osteoarthritis, left elbow: Secondary | ICD-10-CM | POA: Diagnosis not present

## 2020-08-16 DIAGNOSIS — M1812 Unilateral primary osteoarthritis of first carpometacarpal joint, left hand: Secondary | ICD-10-CM | POA: Diagnosis not present

## 2020-08-16 DIAGNOSIS — M65342 Trigger finger, left ring finger: Secondary | ICD-10-CM | POA: Diagnosis not present

## 2020-08-16 DIAGNOSIS — R2231 Localized swelling, mass and lump, right upper limb: Secondary | ICD-10-CM | POA: Diagnosis not present

## 2020-09-13 DIAGNOSIS — M654 Radial styloid tenosynovitis [de Quervain]: Secondary | ICD-10-CM | POA: Diagnosis not present

## 2020-09-25 IMAGING — CT CT PELVIS W/O CM
1 series · 15 of 32 positions shown, 19 images · non-contrast
Comparison: CT scan of the abdomen and pelvis dated 11/09/2010

CLINICAL DATA: Left inguinal pain and soft tissue knot present
since May 2019. History of multiple inguinal hernia repairs.

EXAM:
CT PELVIS WITHOUT CONTRAST
TECHNIQUE: Multidetector CT imaging of the pelvis was performed following the
standard protocol without intravenous contrast.

[Series 2: routine pelvis w/(date) · axial · 0.72mm/px · z∈[-323,-58]mm · 15 of 59 slices shown, 19 images]
[im 4/59  soft-tissue]
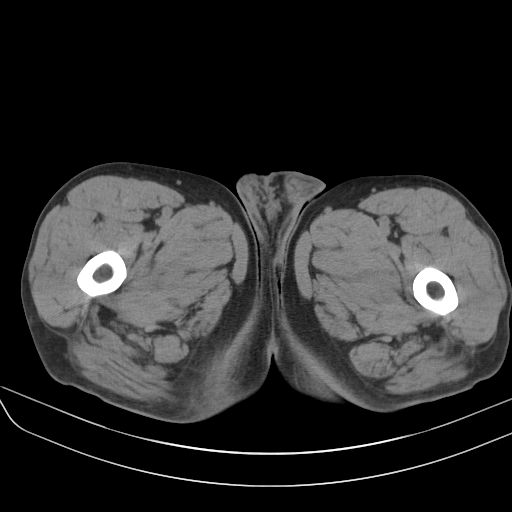
[im 4/59  bone]
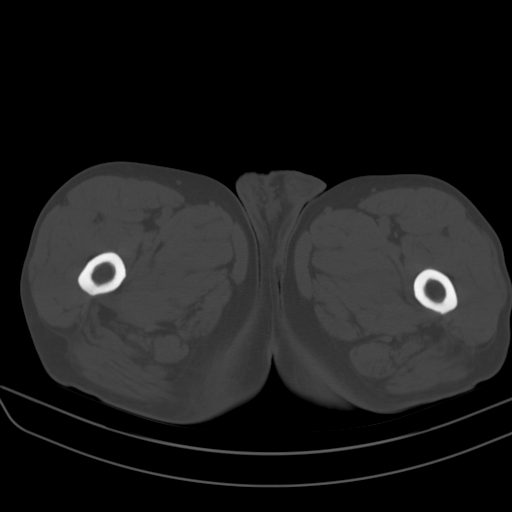
[im 8/59  soft-tissue]
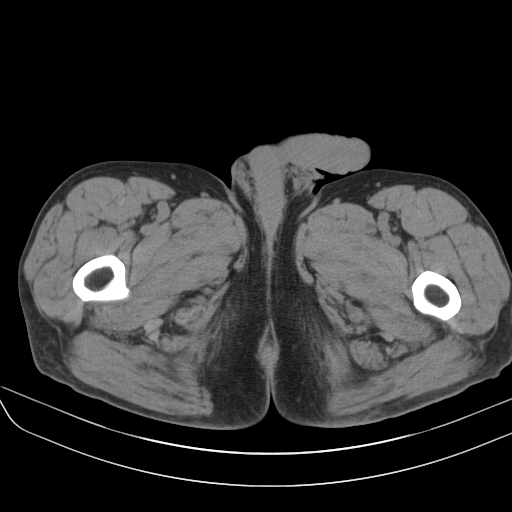
[im 12/59  soft-tissue]
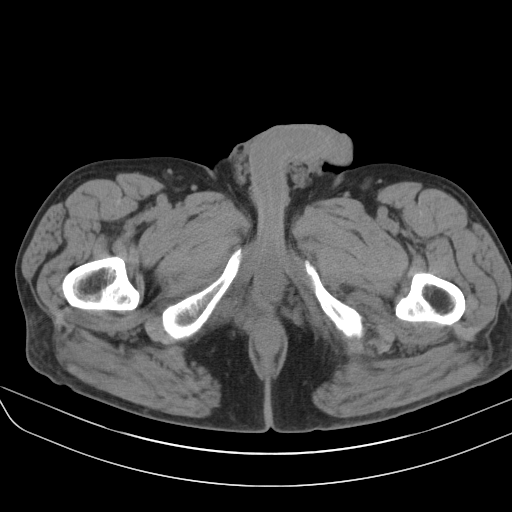
[im 17/59  soft-tissue]
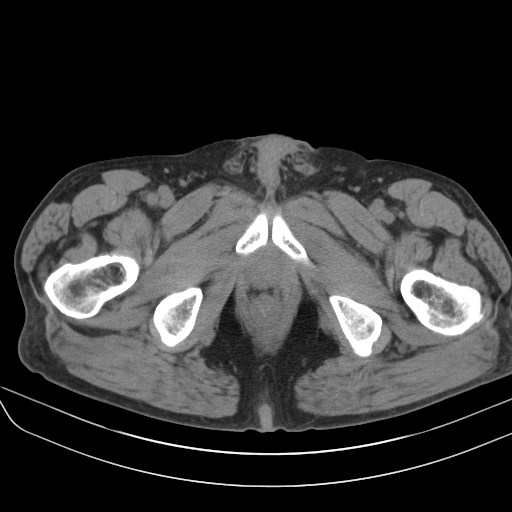
[im 21/59  soft-tissue]
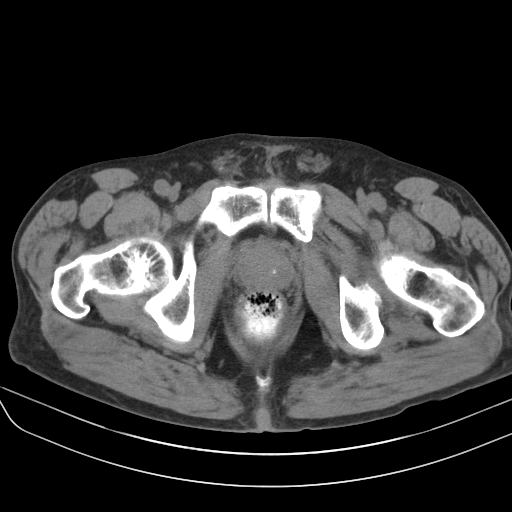
[im 25/59  soft-tissue]
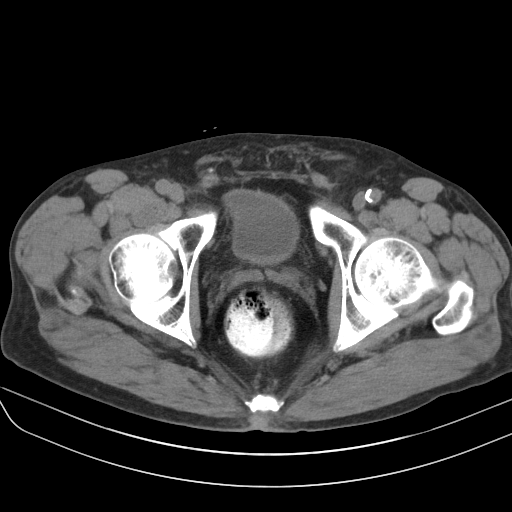
[im 30/59  soft-tissue]
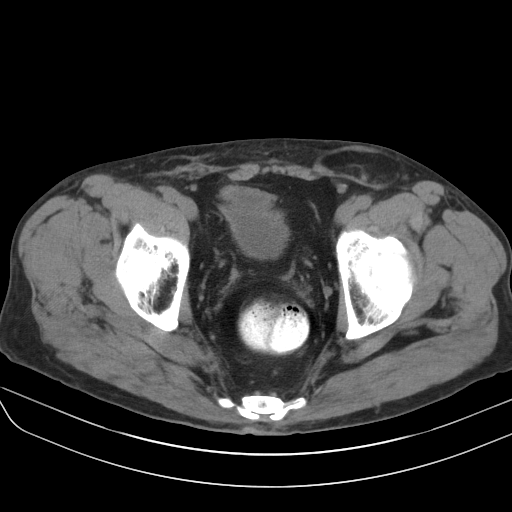
[im 34/59  soft-tissue]
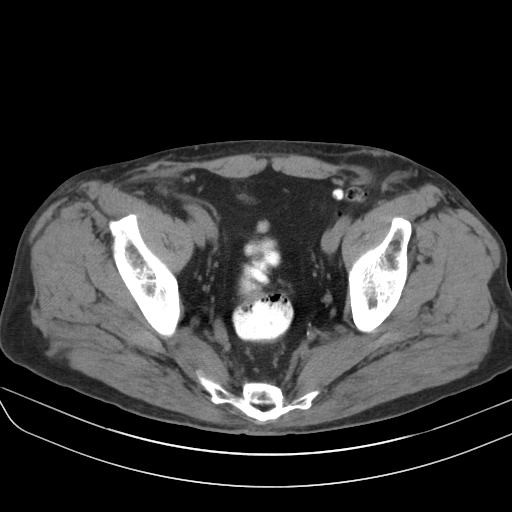
[im 38/59  soft-tissue]
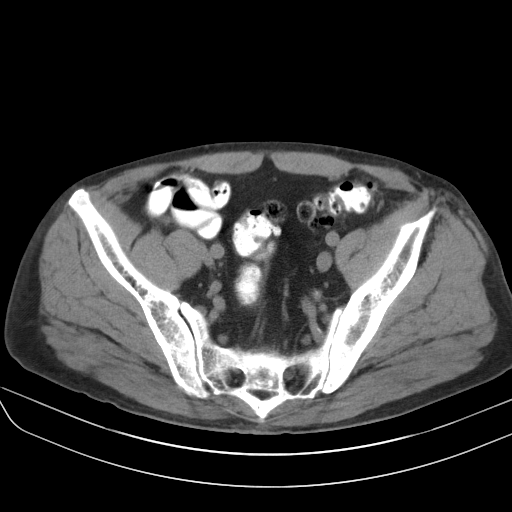
[im 38/59  bone]
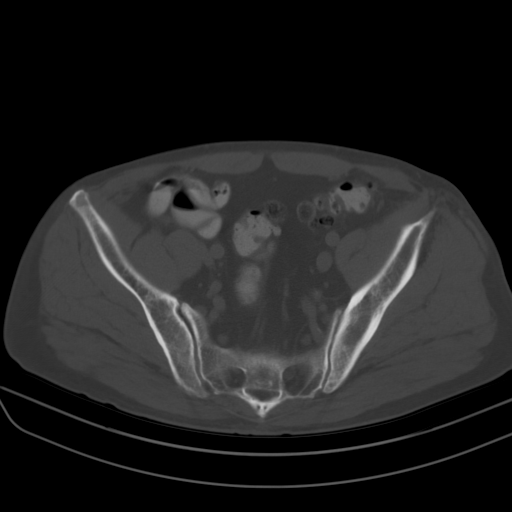
[im 42/59  soft-tissue]
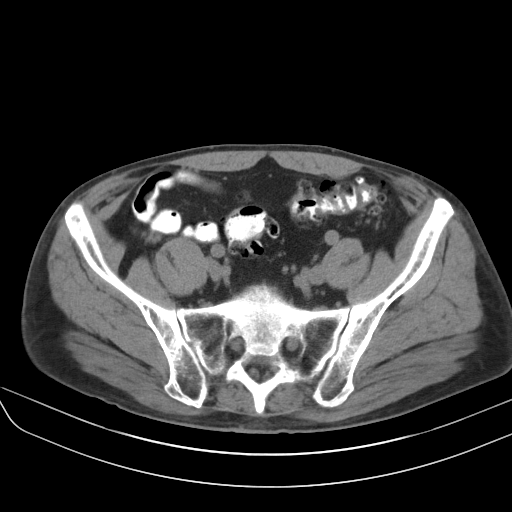
[im 47/59  soft-tissue]
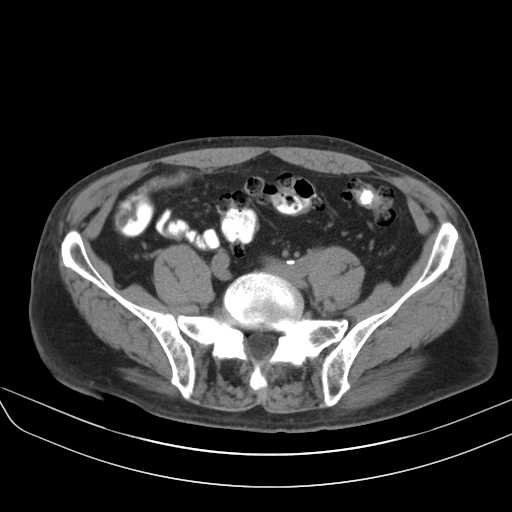
[im 51/59  soft-tissue]
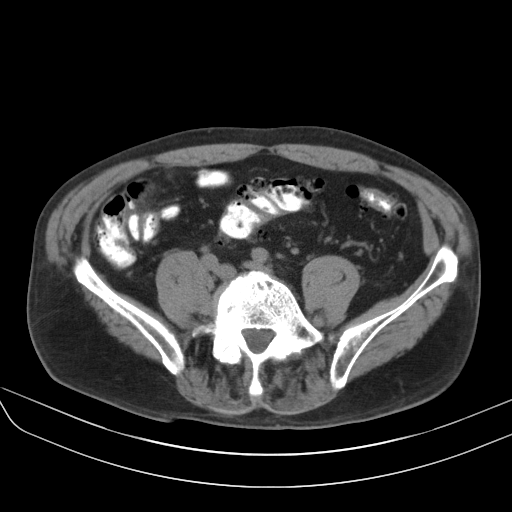
[im 51/59  lung]
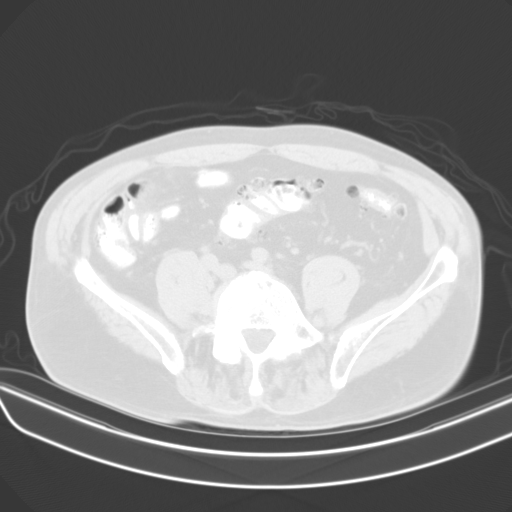
[im 53/59  lung]
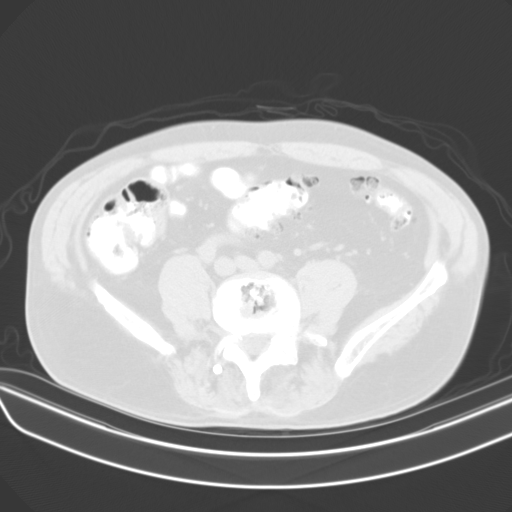
[im 55/59  soft-tissue]
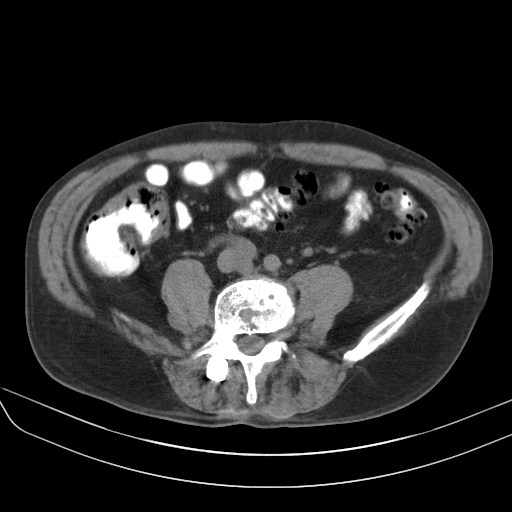
[im 55/59  lung]
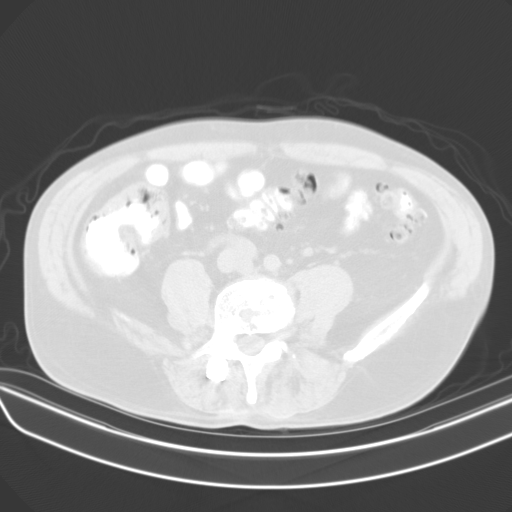
[im 57/59  lung]
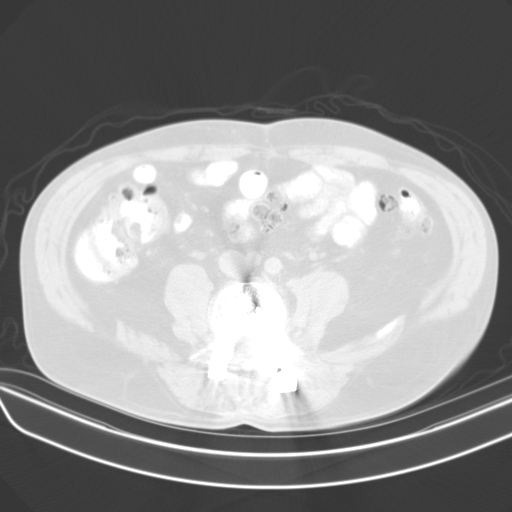

[15 of 32 positions shown; findings below may reference images not displayed]

FINDINGS: Musculoskeletal: There is a left inguinal hernia containing only
fat. This is best visualized on image 27 of series 2. The herniated
fat measures 8.9 x 4.4 x 2.5 cm.

There is a tiny right inguinal hernia containing only fat.

Compared with the prior CT scan of 11/09/2010, the left hernia is
more prominent. The right hernia is minimally more prominent.

Urinary Tract:  No abnormality visualized.

Bowel: Extensive diverticulosis of the distal descending and sigmoid
portions of the colon without diverticulitis.

Vascular/Lymphatic: No pathologically enlarged lymph nodes. No
significant vascular abnormality seen.

Reproductive:  No mass or other significant abnormality

Other: Slight arthritic changes of both hips. Previous lumbar fusion
at L3-4. Chronic degenerative disc disease at L4-5.
IMPRESSION: 1. Progressive left inguinal hernia containing only fat.
2. Tiny right inguinal hernia containing only fat.
3. Extensive chronic diverticulosis of the distal descending and
sigmoid portions of the colon.

## 2020-12-01 DIAGNOSIS — Z7982 Long term (current) use of aspirin: Secondary | ICD-10-CM | POA: Diagnosis not present

## 2020-12-01 DIAGNOSIS — I10 Essential (primary) hypertension: Secondary | ICD-10-CM | POA: Diagnosis not present

## 2020-12-01 DIAGNOSIS — N400 Enlarged prostate without lower urinary tract symptoms: Secondary | ICD-10-CM | POA: Diagnosis not present

## 2020-12-01 DIAGNOSIS — E785 Hyperlipidemia, unspecified: Secondary | ICD-10-CM | POA: Diagnosis not present

## 2020-12-01 DIAGNOSIS — Z85828 Personal history of other malignant neoplasm of skin: Secondary | ICD-10-CM | POA: Diagnosis not present

## 2020-12-01 DIAGNOSIS — M1990 Unspecified osteoarthritis, unspecified site: Secondary | ICD-10-CM | POA: Diagnosis not present

## 2020-12-01 DIAGNOSIS — K219 Gastro-esophageal reflux disease without esophagitis: Secondary | ICD-10-CM | POA: Diagnosis not present

## 2020-12-30 DIAGNOSIS — H353111 Nonexudative age-related macular degeneration, right eye, early dry stage: Secondary | ICD-10-CM | POA: Diagnosis not present

## 2020-12-30 DIAGNOSIS — H35372 Puckering of macula, left eye: Secondary | ICD-10-CM | POA: Diagnosis not present

## 2020-12-30 DIAGNOSIS — H0102A Squamous blepharitis right eye, upper and lower eyelids: Secondary | ICD-10-CM | POA: Diagnosis not present

## 2020-12-30 DIAGNOSIS — H02834 Dermatochalasis of left upper eyelid: Secondary | ICD-10-CM | POA: Diagnosis not present

## 2020-12-30 DIAGNOSIS — H353122 Nonexudative age-related macular degeneration, left eye, intermediate dry stage: Secondary | ICD-10-CM | POA: Diagnosis not present

## 2020-12-30 DIAGNOSIS — H40013 Open angle with borderline findings, low risk, bilateral: Secondary | ICD-10-CM | POA: Diagnosis not present

## 2020-12-30 DIAGNOSIS — H35033 Hypertensive retinopathy, bilateral: Secondary | ICD-10-CM | POA: Diagnosis not present

## 2020-12-30 DIAGNOSIS — H02831 Dermatochalasis of right upper eyelid: Secondary | ICD-10-CM | POA: Diagnosis not present

## 2020-12-30 DIAGNOSIS — H0102B Squamous blepharitis left eye, upper and lower eyelids: Secondary | ICD-10-CM | POA: Diagnosis not present

## 2021-01-02 DIAGNOSIS — Z1389 Encounter for screening for other disorder: Secondary | ICD-10-CM | POA: Diagnosis not present

## 2021-01-02 DIAGNOSIS — Z23 Encounter for immunization: Secondary | ICD-10-CM | POA: Diagnosis not present

## 2021-01-02 DIAGNOSIS — Z0000 Encounter for general adult medical examination without abnormal findings: Secondary | ICD-10-CM | POA: Diagnosis not present

## 2021-01-02 DIAGNOSIS — K219 Gastro-esophageal reflux disease without esophagitis: Secondary | ICD-10-CM | POA: Diagnosis not present

## 2021-01-02 DIAGNOSIS — Z86010 Personal history of colonic polyps: Secondary | ICD-10-CM | POA: Diagnosis not present

## 2021-01-02 DIAGNOSIS — I10 Essential (primary) hypertension: Secondary | ICD-10-CM | POA: Diagnosis not present

## 2021-01-02 DIAGNOSIS — I700 Atherosclerosis of aorta: Secondary | ICD-10-CM | POA: Diagnosis not present

## 2021-01-02 DIAGNOSIS — N400 Enlarged prostate without lower urinary tract symptoms: Secondary | ICD-10-CM | POA: Diagnosis not present

## 2021-01-02 DIAGNOSIS — E782 Mixed hyperlipidemia: Secondary | ICD-10-CM | POA: Diagnosis not present

## 2021-01-02 DIAGNOSIS — M47816 Spondylosis without myelopathy or radiculopathy, lumbar region: Secondary | ICD-10-CM | POA: Diagnosis not present

## 2021-01-25 DIAGNOSIS — Z03818 Encounter for observation for suspected exposure to other biological agents ruled out: Secondary | ICD-10-CM | POA: Diagnosis not present

## 2021-01-25 DIAGNOSIS — R059 Cough, unspecified: Secondary | ICD-10-CM | POA: Diagnosis not present

## 2021-01-26 DIAGNOSIS — J101 Influenza due to other identified influenza virus with other respiratory manifestations: Secondary | ICD-10-CM | POA: Diagnosis not present

## 2021-01-26 DIAGNOSIS — R059 Cough, unspecified: Secondary | ICD-10-CM | POA: Diagnosis not present

## 2021-04-14 DIAGNOSIS — Z85828 Personal history of other malignant neoplasm of skin: Secondary | ICD-10-CM | POA: Diagnosis not present

## 2021-04-14 DIAGNOSIS — L570 Actinic keratosis: Secondary | ICD-10-CM | POA: Diagnosis not present

## 2021-04-14 DIAGNOSIS — D1801 Hemangioma of skin and subcutaneous tissue: Secondary | ICD-10-CM | POA: Diagnosis not present

## 2021-04-14 DIAGNOSIS — L821 Other seborrheic keratosis: Secondary | ICD-10-CM | POA: Diagnosis not present

## 2021-04-14 DIAGNOSIS — L820 Inflamed seborrheic keratosis: Secondary | ICD-10-CM | POA: Diagnosis not present

## 2021-05-03 DIAGNOSIS — M19012 Primary osteoarthritis, left shoulder: Secondary | ICD-10-CM | POA: Diagnosis not present

## 2021-05-03 DIAGNOSIS — Z96611 Presence of right artificial shoulder joint: Secondary | ICD-10-CM | POA: Diagnosis not present

## 2021-05-08 DIAGNOSIS — R269 Unspecified abnormalities of gait and mobility: Secondary | ICD-10-CM | POA: Diagnosis not present

## 2021-05-08 DIAGNOSIS — Z9889 Other specified postprocedural states: Secondary | ICD-10-CM | POA: Diagnosis not present

## 2021-05-08 DIAGNOSIS — M545 Low back pain, unspecified: Secondary | ICD-10-CM | POA: Diagnosis not present

## 2021-05-08 DIAGNOSIS — Z981 Arthrodesis status: Secondary | ICD-10-CM | POA: Diagnosis not present

## 2021-05-29 DIAGNOSIS — M4726 Other spondylosis with radiculopathy, lumbar region: Secondary | ICD-10-CM | POA: Diagnosis not present

## 2021-05-31 DIAGNOSIS — R269 Unspecified abnormalities of gait and mobility: Secondary | ICD-10-CM | POA: Diagnosis not present

## 2021-06-02 DIAGNOSIS — M4726 Other spondylosis with radiculopathy, lumbar region: Secondary | ICD-10-CM | POA: Diagnosis not present

## 2021-06-05 DIAGNOSIS — M4726 Other spondylosis with radiculopathy, lumbar region: Secondary | ICD-10-CM | POA: Diagnosis not present

## 2021-06-06 DIAGNOSIS — M545 Low back pain, unspecified: Secondary | ICD-10-CM | POA: Diagnosis not present

## 2021-06-12 DIAGNOSIS — M4726 Other spondylosis with radiculopathy, lumbar region: Secondary | ICD-10-CM | POA: Diagnosis not present

## 2021-06-13 DIAGNOSIS — M47816 Spondylosis without myelopathy or radiculopathy, lumbar region: Secondary | ICD-10-CM | POA: Diagnosis not present

## 2021-06-19 DIAGNOSIS — M4726 Other spondylosis with radiculopathy, lumbar region: Secondary | ICD-10-CM | POA: Diagnosis not present

## 2021-06-20 DIAGNOSIS — M47816 Spondylosis without myelopathy or radiculopathy, lumbar region: Secondary | ICD-10-CM | POA: Diagnosis not present

## 2021-06-21 DIAGNOSIS — M4726 Other spondylosis with radiculopathy, lumbar region: Secondary | ICD-10-CM | POA: Diagnosis not present

## 2021-06-26 DIAGNOSIS — M4726 Other spondylosis with radiculopathy, lumbar region: Secondary | ICD-10-CM | POA: Diagnosis not present

## 2021-06-29 DIAGNOSIS — M4726 Other spondylosis with radiculopathy, lumbar region: Secondary | ICD-10-CM | POA: Diagnosis not present

## 2021-07-05 DIAGNOSIS — M47816 Spondylosis without myelopathy or radiculopathy, lumbar region: Secondary | ICD-10-CM | POA: Diagnosis not present

## 2021-07-07 DIAGNOSIS — K219 Gastro-esophageal reflux disease without esophagitis: Secondary | ICD-10-CM | POA: Diagnosis not present

## 2021-07-07 DIAGNOSIS — I10 Essential (primary) hypertension: Secondary | ICD-10-CM | POA: Diagnosis not present

## 2021-07-07 DIAGNOSIS — M47816 Spondylosis without myelopathy or radiculopathy, lumbar region: Secondary | ICD-10-CM | POA: Diagnosis not present

## 2021-07-12 DIAGNOSIS — Z79891 Long term (current) use of opiate analgesic: Secondary | ICD-10-CM | POA: Diagnosis not present

## 2021-07-12 DIAGNOSIS — K219 Gastro-esophageal reflux disease without esophagitis: Secondary | ICD-10-CM | POA: Diagnosis not present

## 2021-07-12 DIAGNOSIS — R252 Cramp and spasm: Secondary | ICD-10-CM | POA: Diagnosis not present

## 2021-07-12 DIAGNOSIS — M47816 Spondylosis without myelopathy or radiculopathy, lumbar region: Secondary | ICD-10-CM | POA: Diagnosis not present

## 2021-07-12 DIAGNOSIS — Z791 Long term (current) use of non-steroidal anti-inflammatories (NSAID): Secondary | ICD-10-CM | POA: Diagnosis not present

## 2021-07-18 DIAGNOSIS — M4726 Other spondylosis with radiculopathy, lumbar region: Secondary | ICD-10-CM | POA: Diagnosis not present

## 2022-01-10 DIAGNOSIS — Z5181 Encounter for therapeutic drug level monitoring: Secondary | ICD-10-CM | POA: Diagnosis not present

## 2022-01-10 DIAGNOSIS — I10 Essential (primary) hypertension: Secondary | ICD-10-CM | POA: Diagnosis not present

## 2022-01-10 DIAGNOSIS — M47816 Spondylosis without myelopathy or radiculopathy, lumbar region: Secondary | ICD-10-CM | POA: Diagnosis not present

## 2022-01-10 DIAGNOSIS — Z0000 Encounter for general adult medical examination without abnormal findings: Secondary | ICD-10-CM | POA: Diagnosis not present

## 2022-01-10 DIAGNOSIS — K219 Gastro-esophageal reflux disease without esophagitis: Secondary | ICD-10-CM | POA: Diagnosis not present

## 2022-01-10 DIAGNOSIS — N400 Enlarged prostate without lower urinary tract symptoms: Secondary | ICD-10-CM | POA: Diagnosis not present

## 2022-01-10 DIAGNOSIS — E782 Mixed hyperlipidemia: Secondary | ICD-10-CM | POA: Diagnosis not present

## 2022-01-10 DIAGNOSIS — Z125 Encounter for screening for malignant neoplasm of prostate: Secondary | ICD-10-CM | POA: Diagnosis not present

## 2022-01-10 DIAGNOSIS — Z1331 Encounter for screening for depression: Secondary | ICD-10-CM | POA: Diagnosis not present

## 2022-01-10 DIAGNOSIS — Z86010 Personal history of colonic polyps: Secondary | ICD-10-CM | POA: Diagnosis not present

## 2022-01-10 DIAGNOSIS — I700 Atherosclerosis of aorta: Secondary | ICD-10-CM | POA: Diagnosis not present

## 2022-01-15 DIAGNOSIS — H2511 Age-related nuclear cataract, right eye: Secondary | ICD-10-CM | POA: Diagnosis not present

## 2022-01-15 DIAGNOSIS — H02834 Dermatochalasis of left upper eyelid: Secondary | ICD-10-CM | POA: Diagnosis not present

## 2022-01-15 DIAGNOSIS — H1045 Other chronic allergic conjunctivitis: Secondary | ICD-10-CM | POA: Diagnosis not present

## 2022-01-15 DIAGNOSIS — H35033 Hypertensive retinopathy, bilateral: Secondary | ICD-10-CM | POA: Diagnosis not present

## 2022-01-15 DIAGNOSIS — H35372 Puckering of macula, left eye: Secondary | ICD-10-CM | POA: Diagnosis not present

## 2022-01-15 DIAGNOSIS — H02831 Dermatochalasis of right upper eyelid: Secondary | ICD-10-CM | POA: Diagnosis not present

## 2022-01-15 DIAGNOSIS — H353111 Nonexudative age-related macular degeneration, right eye, early dry stage: Secondary | ICD-10-CM | POA: Diagnosis not present

## 2022-01-15 DIAGNOSIS — H353122 Nonexudative age-related macular degeneration, left eye, intermediate dry stage: Secondary | ICD-10-CM | POA: Diagnosis not present

## 2022-01-15 DIAGNOSIS — H40013 Open angle with borderline findings, low risk, bilateral: Secondary | ICD-10-CM | POA: Diagnosis not present

## 2022-05-24 DIAGNOSIS — L570 Actinic keratosis: Secondary | ICD-10-CM | POA: Diagnosis not present

## 2022-05-24 DIAGNOSIS — Z85828 Personal history of other malignant neoplasm of skin: Secondary | ICD-10-CM | POA: Diagnosis not present

## 2022-05-24 DIAGNOSIS — L821 Other seborrheic keratosis: Secondary | ICD-10-CM | POA: Diagnosis not present

## 2022-05-24 DIAGNOSIS — D1801 Hemangioma of skin and subcutaneous tissue: Secondary | ICD-10-CM | POA: Diagnosis not present

## 2022-08-08 DIAGNOSIS — I700 Atherosclerosis of aorta: Secondary | ICD-10-CM | POA: Diagnosis not present

## 2022-08-08 DIAGNOSIS — E782 Mixed hyperlipidemia: Secondary | ICD-10-CM | POA: Diagnosis not present

## 2022-08-08 DIAGNOSIS — I10 Essential (primary) hypertension: Secondary | ICD-10-CM | POA: Diagnosis not present

## 2022-08-08 DIAGNOSIS — M47816 Spondylosis without myelopathy or radiculopathy, lumbar region: Secondary | ICD-10-CM | POA: Diagnosis not present

## 2022-08-08 DIAGNOSIS — M2550 Pain in unspecified joint: Secondary | ICD-10-CM | POA: Diagnosis not present

## 2022-08-08 DIAGNOSIS — Z9989 Dependence on other enabling machines and devices: Secondary | ICD-10-CM | POA: Diagnosis not present

## 2022-08-08 DIAGNOSIS — K219 Gastro-esophageal reflux disease without esophagitis: Secondary | ICD-10-CM | POA: Diagnosis not present

## 2022-08-08 DIAGNOSIS — M792 Neuralgia and neuritis, unspecified: Secondary | ICD-10-CM | POA: Diagnosis not present

## 2022-08-08 DIAGNOSIS — N400 Enlarged prostate without lower urinary tract symptoms: Secondary | ICD-10-CM | POA: Diagnosis not present

## 2023-01-17 ENCOUNTER — Other Ambulatory Visit: Payer: Self-pay | Admitting: Internal Medicine

## 2023-01-17 DIAGNOSIS — Z0000 Encounter for general adult medical examination without abnormal findings: Secondary | ICD-10-CM | POA: Diagnosis not present

## 2023-01-17 DIAGNOSIS — E782 Mixed hyperlipidemia: Secondary | ICD-10-CM | POA: Diagnosis not present

## 2023-01-17 DIAGNOSIS — M2550 Pain in unspecified joint: Secondary | ICD-10-CM | POA: Diagnosis not present

## 2023-01-17 DIAGNOSIS — Z5181 Encounter for therapeutic drug level monitoring: Secondary | ICD-10-CM | POA: Diagnosis not present

## 2023-01-17 DIAGNOSIS — N400 Enlarged prostate without lower urinary tract symptoms: Secondary | ICD-10-CM | POA: Diagnosis not present

## 2023-01-17 DIAGNOSIS — R079 Chest pain, unspecified: Secondary | ICD-10-CM | POA: Diagnosis not present

## 2023-01-17 DIAGNOSIS — I10 Essential (primary) hypertension: Secondary | ICD-10-CM | POA: Diagnosis not present

## 2023-01-17 DIAGNOSIS — I700 Atherosclerosis of aorta: Secondary | ICD-10-CM | POA: Diagnosis not present

## 2023-01-17 DIAGNOSIS — R200 Anesthesia of skin: Secondary | ICD-10-CM

## 2023-01-17 DIAGNOSIS — M792 Neuralgia and neuritis, unspecified: Secondary | ICD-10-CM | POA: Diagnosis not present

## 2023-01-17 DIAGNOSIS — K219 Gastro-esophageal reflux disease without esophagitis: Secondary | ICD-10-CM | POA: Diagnosis not present

## 2023-01-17 DIAGNOSIS — Z125 Encounter for screening for malignant neoplasm of prostate: Secondary | ICD-10-CM | POA: Diagnosis not present

## 2023-02-24 ENCOUNTER — Ambulatory Visit
Admission: RE | Admit: 2023-02-24 | Discharge: 2023-02-24 | Disposition: A | Discharge status: Home / Self Care | Payer: Medicare PPO | Source: Ambulatory Visit | Attending: Internal Medicine | Admitting: Internal Medicine

## 2023-02-24 DIAGNOSIS — R200 Anesthesia of skin: Secondary | ICD-10-CM

## 2023-02-27 DIAGNOSIS — H2511 Age-related nuclear cataract, right eye: Secondary | ICD-10-CM | POA: Diagnosis not present

## 2023-02-27 DIAGNOSIS — H35372 Puckering of macula, left eye: Secondary | ICD-10-CM | POA: Diagnosis not present

## 2023-02-27 DIAGNOSIS — H40013 Open angle with borderline findings, low risk, bilateral: Secondary | ICD-10-CM | POA: Diagnosis not present

## 2023-02-27 DIAGNOSIS — H353132 Nonexudative age-related macular degeneration, bilateral, intermediate dry stage: Secondary | ICD-10-CM | POA: Diagnosis not present

## 2023-02-27 DIAGNOSIS — H02831 Dermatochalasis of right upper eyelid: Secondary | ICD-10-CM | POA: Diagnosis not present

## 2023-02-27 DIAGNOSIS — H35033 Hypertensive retinopathy, bilateral: Secondary | ICD-10-CM | POA: Diagnosis not present

## 2023-02-27 DIAGNOSIS — H1045 Other chronic allergic conjunctivitis: Secondary | ICD-10-CM | POA: Diagnosis not present

## 2023-02-27 DIAGNOSIS — H02834 Dermatochalasis of left upper eyelid: Secondary | ICD-10-CM | POA: Diagnosis not present

## 2023-03-05 DIAGNOSIS — R972 Elevated prostate specific antigen [PSA]: Secondary | ICD-10-CM | POA: Diagnosis not present

## 2023-03-18 ENCOUNTER — Encounter (INDEPENDENT_AMBULATORY_CARE_PROVIDER_SITE_OTHER): Payer: Self-pay | Admitting: Ophthalmology

## 2023-03-18 DIAGNOSIS — H2511 Age-related nuclear cataract, right eye: Secondary | ICD-10-CM | POA: Diagnosis not present

## 2023-03-18 DIAGNOSIS — H4422 Degenerative myopia, left eye: Secondary | ICD-10-CM | POA: Diagnosis not present

## 2023-03-18 DIAGNOSIS — H43811 Vitreous degeneration, right eye: Secondary | ICD-10-CM | POA: Diagnosis not present

## 2023-03-18 DIAGNOSIS — I10 Essential (primary) hypertension: Secondary | ICD-10-CM

## 2023-03-18 DIAGNOSIS — H35033 Hypertensive retinopathy, bilateral: Secondary | ICD-10-CM | POA: Diagnosis not present

## 2023-04-02 ENCOUNTER — Ambulatory Visit
Admission: RE | Admit: 2023-04-02 | Discharge: 2023-04-02 | Disposition: A | Discharge status: Home / Self Care | Payer: Medicare PPO | Source: Ambulatory Visit | Attending: Internal Medicine | Admitting: Internal Medicine

## 2023-04-02 DIAGNOSIS — G9389 Other specified disorders of brain: Secondary | ICD-10-CM | POA: Diagnosis not present

## 2023-04-02 DIAGNOSIS — R200 Anesthesia of skin: Secondary | ICD-10-CM

## 2023-04-02 MED ORDER — GADOPICLENOL 0.5 MMOL/ML IV SOLN
7.5000 mL | Freq: Once | INTRAVENOUS | Status: AC | PRN
  Administered 2023-04-02: 7.5 mL via INTRAVENOUS

## 2023-04-22 DIAGNOSIS — M542 Cervicalgia: Secondary | ICD-10-CM | POA: Diagnosis not present

## 2023-05-10 DIAGNOSIS — M47812 Spondylosis without myelopathy or radiculopathy, cervical region: Secondary | ICD-10-CM | POA: Diagnosis not present

## 2023-06-05 DIAGNOSIS — M47812 Spondylosis without myelopathy or radiculopathy, cervical region: Secondary | ICD-10-CM | POA: Diagnosis not present

## 2023-06-21 DIAGNOSIS — M5416 Radiculopathy, lumbar region: Secondary | ICD-10-CM | POA: Diagnosis not present

## 2023-06-27 DIAGNOSIS — M545 Low back pain, unspecified: Secondary | ICD-10-CM | POA: Diagnosis not present

## 2023-07-05 DIAGNOSIS — M545 Low back pain, unspecified: Secondary | ICD-10-CM | POA: Diagnosis not present

## 2023-07-16 DIAGNOSIS — M47816 Spondylosis without myelopathy or radiculopathy, lumbar region: Secondary | ICD-10-CM | POA: Diagnosis not present

## 2023-07-19 DIAGNOSIS — R748 Abnormal levels of other serum enzymes: Secondary | ICD-10-CM | POA: Diagnosis not present

## 2023-07-19 DIAGNOSIS — R079 Chest pain, unspecified: Secondary | ICD-10-CM | POA: Diagnosis not present

## 2023-07-19 DIAGNOSIS — M47816 Spondylosis without myelopathy or radiculopathy, lumbar region: Secondary | ICD-10-CM | POA: Diagnosis not present

## 2023-07-19 DIAGNOSIS — M2550 Pain in unspecified joint: Secondary | ICD-10-CM | POA: Diagnosis not present

## 2023-07-19 DIAGNOSIS — G8929 Other chronic pain: Secondary | ICD-10-CM | POA: Diagnosis not present

## 2023-07-19 DIAGNOSIS — I10 Essential (primary) hypertension: Secondary | ICD-10-CM | POA: Diagnosis not present

## 2023-07-19 DIAGNOSIS — R9089 Other abnormal findings on diagnostic imaging of central nervous system: Secondary | ICD-10-CM | POA: Diagnosis not present

## 2023-07-19 DIAGNOSIS — G43909 Migraine, unspecified, not intractable, without status migrainosus: Secondary | ICD-10-CM | POA: Diagnosis not present

## 2023-07-19 DIAGNOSIS — R200 Anesthesia of skin: Secondary | ICD-10-CM | POA: Diagnosis not present

## 2023-07-30 DIAGNOSIS — R079 Chest pain, unspecified: Secondary | ICD-10-CM | POA: Diagnosis not present

## 2023-07-31 DIAGNOSIS — M47816 Spondylosis without myelopathy or radiculopathy, lumbar region: Secondary | ICD-10-CM | POA: Diagnosis not present

## 2023-08-05 DIAGNOSIS — H35372 Puckering of macula, left eye: Secondary | ICD-10-CM | POA: Diagnosis not present

## 2023-08-05 DIAGNOSIS — H31092 Other chorioretinal scars, left eye: Secondary | ICD-10-CM | POA: Diagnosis not present

## 2023-08-05 DIAGNOSIS — H2511 Age-related nuclear cataract, right eye: Secondary | ICD-10-CM | POA: Diagnosis not present

## 2023-08-05 DIAGNOSIS — H43813 Vitreous degeneration, bilateral: Secondary | ICD-10-CM | POA: Diagnosis not present

## 2023-08-23 DIAGNOSIS — M47816 Spondylosis without myelopathy or radiculopathy, lumbar region: Secondary | ICD-10-CM | POA: Diagnosis not present

## 2023-09-03 DIAGNOSIS — H2511 Age-related nuclear cataract, right eye: Secondary | ICD-10-CM | POA: Diagnosis not present

## 2023-09-03 DIAGNOSIS — H353132 Nonexudative age-related macular degeneration, bilateral, intermediate dry stage: Secondary | ICD-10-CM | POA: Diagnosis not present

## 2023-09-03 DIAGNOSIS — H5231 Anisometropia: Secondary | ICD-10-CM | POA: Diagnosis not present

## 2023-09-03 DIAGNOSIS — H35033 Hypertensive retinopathy, bilateral: Secondary | ICD-10-CM | POA: Diagnosis not present

## 2023-09-03 DIAGNOSIS — H35372 Puckering of macula, left eye: Secondary | ICD-10-CM | POA: Diagnosis not present

## 2023-09-03 DIAGNOSIS — H40013 Open angle with borderline findings, low risk, bilateral: Secondary | ICD-10-CM | POA: Diagnosis not present

## 2023-09-03 DIAGNOSIS — Z961 Presence of intraocular lens: Secondary | ICD-10-CM | POA: Diagnosis not present

## 2023-09-04 DIAGNOSIS — M47816 Spondylosis without myelopathy or radiculopathy, lumbar region: Secondary | ICD-10-CM | POA: Diagnosis not present

## 2023-10-03 ENCOUNTER — Encounter: Payer: Self-pay | Admitting: Diagnostic Neuroimaging

## 2023-10-03 ENCOUNTER — Ambulatory Visit (INDEPENDENT_AMBULATORY_CARE_PROVIDER_SITE_OTHER): Admitting: Diagnostic Neuroimaging

## 2023-10-03 VITALS — BP 158/88 | HR 82 | Ht 72.0 in | Wt 167.6 lb

## 2023-10-03 DIAGNOSIS — I6789 Other cerebrovascular disease: Secondary | ICD-10-CM

## 2023-10-03 DIAGNOSIS — G43009 Migraine without aura, not intractable, without status migrainosus: Secondary | ICD-10-CM

## 2023-10-03 NOTE — Patient Instructions (Signed)
 MIGRAINE HEADACHES / VERTIGO (nausea, sens to light, right sided headache)  MIGRAINE PREVENTION  LIFESTYLE CHANGES -Stop or avoid smoking -Decrease or avoid caffeine / alcohol -Eat and sleep on a regular schedule -Exercise several times per week  Consider 1st line prevention (if migraines > 4 per month) - consider topiramate 50mg  at bedtime; after 1-2 weeks increase to 50mg  twice a day; drink plenty of water  - consider propranolol 40mg  daily; after 1-2 weeks increase to 40mg  twice a day  MIGRAINE RESCUE  - ibuprofen, tylenol  as needed - avoid triptans (reported history of stroke, but not clear cut) - consider rimegepant (Nurtec) 75mg  as needed for breakthrough headache; max 8 per month  ABNORMAL MRI BRAIN (findings consistent with chronic small vessel ischemic disease related to hypertension and hyperlipidemia) - continue BP control, lipid control
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
## 2023-10-04 ENCOUNTER — Ambulatory Visit: Admitting: Diagnostic Neuroimaging

## 2023-10-24 DIAGNOSIS — H2511 Age-related nuclear cataract, right eye: Secondary | ICD-10-CM | POA: Diagnosis not present

## 2023-10-30 DIAGNOSIS — M47816 Spondylosis without myelopathy or radiculopathy, lumbar region: Secondary | ICD-10-CM | POA: Diagnosis not present

## 2023-11-19 DIAGNOSIS — M47812 Spondylosis without myelopathy or radiculopathy, cervical region: Secondary | ICD-10-CM | POA: Diagnosis not present

## 2023-11-19 DIAGNOSIS — M47816 Spondylosis without myelopathy or radiculopathy, lumbar region: Secondary | ICD-10-CM | POA: Diagnosis not present

## 2023-11-19 DIAGNOSIS — M542 Cervicalgia: Secondary | ICD-10-CM | POA: Diagnosis not present

## 2023-12-11 DIAGNOSIS — M47816 Spondylosis without myelopathy or radiculopathy, lumbar region: Secondary | ICD-10-CM | POA: Diagnosis not present

## 2023-12-31 DIAGNOSIS — M65342 Trigger finger, left ring finger: Secondary | ICD-10-CM | POA: Diagnosis not present

## 2024-01-14 DIAGNOSIS — M47816 Spondylosis without myelopathy or radiculopathy, lumbar region: Secondary | ICD-10-CM | POA: Diagnosis not present

## 2024-01-14 DIAGNOSIS — M47812 Spondylosis without myelopathy or radiculopathy, cervical region: Secondary | ICD-10-CM | POA: Diagnosis not present

## 2024-01-22 DIAGNOSIS — M65342 Trigger finger, left ring finger: Secondary | ICD-10-CM | POA: Diagnosis not present

## 2024-02-25 ENCOUNTER — Ambulatory Visit: Payer: Self-pay | Admitting: General Surgery

## 2024-03-05 ENCOUNTER — Encounter (HOSPITAL_BASED_OUTPATIENT_CLINIC_OR_DEPARTMENT_OTHER)
Admission: RE | Admit: 2024-03-05 | Discharge: 2024-03-05 | Disposition: A | Discharge status: Home / Self Care | Source: Ambulatory Visit | Attending: General Surgery | Admitting: General Surgery

## 2024-03-05 ENCOUNTER — Encounter (HOSPITAL_BASED_OUTPATIENT_CLINIC_OR_DEPARTMENT_OTHER): Payer: Self-pay | Admitting: General Surgery

## 2024-03-05 DIAGNOSIS — Z01818 Encounter for other preprocedural examination: Secondary | ICD-10-CM | POA: Insufficient documentation

## 2024-03-05 LAB — BASIC METABOLIC PANEL WITH GFR
Anion gap: 9 (ref 5–15)
BUN: 28 mg/dL — ABNORMAL HIGH (ref 8–23)
CO2: 27 mmol/L (ref 22–32)
Calcium: 9.3 mg/dL (ref 8.9–10.3)
Chloride: 103 mmol/L (ref 98–111)
Creatinine, Ser: 1.07 mg/dL (ref 0.61–1.24)
GFR, Estimated: >60 mL/min
Glucose, Bld: 105 mg/dL — ABNORMAL HIGH (ref 70–99)
Potassium: 5.7 mmol/L — ABNORMAL HIGH (ref 3.5–5.1)
Sodium: 139 mmol/L (ref 135–145)

## 2024-03-05 MED ORDER — CHLORHEXIDINE GLUCONATE CLOTH 2 % EX PADS: 6.0000 | MEDICATED_PAD | Freq: Once | CUTANEOUS | Status: DC

## 2024-03-05 NOTE — Progress Notes (Signed)

## 2024-03-06 NOTE — Progress Notes (Signed)
 K+ 5.7, Dr. Roberto aware, will redraw morning of surgery, Left message for patient to be here at 6am.

## 2024-03-18 ENCOUNTER — Encounter (INDEPENDENT_AMBULATORY_CARE_PROVIDER_SITE_OTHER): Payer: Medicare PPO | Admitting: Ophthalmology

## 2024-03-18 DIAGNOSIS — H35033 Hypertensive retinopathy, bilateral: Secondary | ICD-10-CM

## 2024-03-18 DIAGNOSIS — H4422 Degenerative myopia, left eye: Secondary | ICD-10-CM | POA: Diagnosis not present

## 2024-03-18 DIAGNOSIS — H43811 Vitreous degeneration, right eye: Secondary | ICD-10-CM | POA: Diagnosis not present

## 2024-03-18 DIAGNOSIS — I10 Essential (primary) hypertension: Secondary | ICD-10-CM | POA: Diagnosis not present

## 2024-03-18 DIAGNOSIS — H35372 Puckering of macula, left eye: Secondary | ICD-10-CM

## 2024-03-19 NOTE — Anesthesia Preprocedure Evaluation (Signed)
"                                    Anesthesia Evaluation  Patient identified by MRN, date of birth, ID band Patient awake    Reviewed: Allergy & Precautions, NPO status , Patient's Chart, lab work & pertinent test results, reviewed documented beta blocker date and time   History of Anesthesia Complications Negative for: history of anesthetic complications  Airway Mallampati: II  TM Distance: >3 FB Neck ROM: Limited    Dental  (+) Dental Advisory Given, Teeth Intact   Pulmonary sleep apnea    Pulmonary exam normal        Cardiovascular hypertension, Pt. on medications and Pt. on home beta blockers Normal cardiovascular exam     Neuro/Psych  Headaches  Deaf   negative psych ROS   GI/Hepatic Neg liver ROS,GERD  Controlled and Medicated,,  Endo/Other  negative endocrine ROS    Renal/GU negative Renal ROS     Musculoskeletal  (+) Arthritis ,    Abdominal   Peds  Hematology negative hematology ROS (+)   Anesthesia Other Findings   Reproductive/Obstetrics                              Anesthesia Physical Anesthesia Plan  ASA: 2  Anesthesia Plan: General   Post-op Pain Management: Tylenol  PO (pre-op)*   Induction: Intravenous  PONV Risk Score and Plan: 2 and Treatment may vary due to age or medical condition, Ondansetron  and Dexamethasone   Airway Management Planned: Oral ETT  Additional Equipment: None  Intra-op Plan:   Post-operative Plan: Extubation in OR  Informed Consent: I have reviewed the patients History and Physical, chart, labs and discussed the procedure including the risks, benefits and alternatives for the proposed anesthesia with the patient or authorized representative who has indicated his/her understanding and acceptance.     Dental advisory given  Plan Discussed with: CRNA, Anesthesiologist and Surgeon  Anesthesia Plan Comments:          Anesthesia Quick Evaluation  "

## 2024-03-20 ENCOUNTER — Encounter (HOSPITAL_BASED_OUTPATIENT_CLINIC_OR_DEPARTMENT_OTHER): Admission: RE | Disposition: A | Payer: Self-pay | Source: Home / Self Care | Attending: General Surgery

## 2024-03-20 ENCOUNTER — Encounter (HOSPITAL_BASED_OUTPATIENT_CLINIC_OR_DEPARTMENT_OTHER): Payer: Self-pay | Admitting: General Surgery

## 2024-03-20 ENCOUNTER — Ambulatory Visit (HOSPITAL_BASED_OUTPATIENT_CLINIC_OR_DEPARTMENT_OTHER)
Admission: RE | Admit: 2024-03-20 | Discharge: 2024-03-20 | Disposition: A | Source: Home / Self Care | Attending: General Surgery | Admitting: General Surgery

## 2024-03-20 ENCOUNTER — Ambulatory Visit (HOSPITAL_BASED_OUTPATIENT_CLINIC_OR_DEPARTMENT_OTHER): Payer: Self-pay | Admitting: Anesthesiology

## 2024-03-20 ENCOUNTER — Other Ambulatory Visit: Payer: Self-pay

## 2024-03-20 DIAGNOSIS — I10 Essential (primary) hypertension: Secondary | ICD-10-CM

## 2024-03-20 LAB — BASIC METABOLIC PANEL WITH GFR
Anion gap: 10 (ref 5–15)
BUN: 27 mg/dL — ABNORMAL HIGH (ref 8–23)
CO2: 24 mmol/L (ref 22–32)
Calcium: 9.1 mg/dL (ref 8.9–10.3)
Chloride: 107 mmol/L (ref 98–111)
Creatinine, Ser: 0.99 mg/dL (ref 0.61–1.24)
GFR, Estimated: >60 mL/min
Glucose, Bld: 74 mg/dL (ref 70–99)
Potassium: 4 mmol/L (ref 3.5–5.1)
Sodium: 142 mmol/L (ref 135–145)

## 2024-03-20 MED ORDER — ROCURONIUM BROMIDE 10 MG/ML (PF) SYRINGE
PREFILLED_SYRINGE | INTRAVENOUS | Status: DC | PRN
  Administered 2024-03-20: 50 mg via INTRAVENOUS

## 2024-03-20 MED ORDER — DEXAMETHASONE SOD PHOSPHATE PF 10 MG/ML IJ SOLN
INTRAMUSCULAR | Status: AC
  Filled 2024-03-20: qty 1

## 2024-03-20 MED ORDER — 0.9 % SODIUM CHLORIDE (POUR BTL) OPTIME
TOPICAL | Status: DC | PRN
  Administered 2024-03-20: 1000 mL

## 2024-03-20 MED ORDER — EPHEDRINE SULFATE-NACL 50-0.9 MG/10ML-% IV SOSY
PREFILLED_SYRINGE | INTRAVENOUS | Status: DC | PRN
  Administered 2024-03-20 (×5): 5 mg via INTRAVENOUS

## 2024-03-20 MED ORDER — OXYCODONE HCL 5 MG PO TABS: 5.0000 mg | ORAL_TABLET | Freq: Once | ORAL | Status: DC | PRN

## 2024-03-20 MED ORDER — LIDOCAINE 2% (20 MG/ML) 5 ML SYRINGE
INTRAMUSCULAR | Status: AC
  Filled 2024-03-20: qty 5

## 2024-03-20 MED ORDER — CEFAZOLIN SODIUM-DEXTROSE 2-4 GM/100ML-% IV SOLN
2.0000 g | INTRAVENOUS | Status: AC
  Administered 2024-03-20: 2 g via INTRAVENOUS

## 2024-03-20 MED ORDER — OXYCODONE HCL 5 MG/5ML PO SOLN: 5.0000 mg | Freq: Once | ORAL | Status: DC | PRN

## 2024-03-20 MED ORDER — FENTANYL CITRATE (PF) 100 MCG/2ML IJ SOLN
25.0000 ug | INTRAMUSCULAR | Status: DC | PRN
  Administered 2024-03-20 (×3): 25 ug via INTRAVENOUS

## 2024-03-20 MED ORDER — LIDOCAINE HCL (PF) 2 % IJ SOLN
INTRAMUSCULAR | Status: DC | PRN
  Administered 2024-03-20: 80 mg via INTRADERMAL

## 2024-03-20 MED ORDER — DEXAMETHASONE SOD PHOSPHATE PF 10 MG/ML IJ SOLN
INTRAMUSCULAR | Status: DC | PRN
  Administered 2024-03-20: 10 mg via INTRAVENOUS

## 2024-03-20 MED ORDER — OXYCODONE HCL 5 MG PO TABS: 5.0000 mg | ORAL_TABLET | Freq: Four times a day (QID) | ORAL | 0 refills | Status: AC | PRN

## 2024-03-20 MED ORDER — ROCURONIUM BROMIDE 10 MG/ML (PF) SYRINGE
PREFILLED_SYRINGE | INTRAVENOUS | Status: AC
  Filled 2024-03-20: qty 10

## 2024-03-20 MED ORDER — ONDANSETRON HCL 4 MG/2ML IJ SOLN
INTRAMUSCULAR | Status: AC
  Filled 2024-03-20: qty 2

## 2024-03-20 MED ORDER — MIDAZOLAM HCL 2 MG/2ML IJ SOLN
INTRAMUSCULAR | Status: AC
  Filled 2024-03-20: qty 2

## 2024-03-20 MED ORDER — BUPIVACAINE HCL (PF) 0.25 % IJ SOLN
INTRAMUSCULAR | Status: AC
  Filled 2024-03-20: qty 30

## 2024-03-20 MED ORDER — FENTANYL CITRATE (PF) 100 MCG/2ML IJ SOLN
INTRAMUSCULAR | Status: AC
  Filled 2024-03-20: qty 2

## 2024-03-20 MED ORDER — DEXMEDETOMIDINE HCL IN NACL 80 MCG/20ML IV SOLN
INTRAVENOUS | Status: AC
  Filled 2024-03-20: qty 20

## 2024-03-20 MED ORDER — CEFAZOLIN SODIUM-DEXTROSE 2-4 GM/100ML-% IV SOLN
INTRAVENOUS | Status: AC
  Filled 2024-03-20: qty 100

## 2024-03-20 MED ORDER — LACTATED RINGERS IV SOLN: INTRAVENOUS | Status: DC

## 2024-03-20 MED ORDER — GABAPENTIN 100 MG PO CAPS
100.0000 mg | ORAL_CAPSULE | ORAL | Status: AC
  Administered 2024-03-20: 100 mg via ORAL

## 2024-03-20 MED ORDER — PROPOFOL 10 MG/ML IV BOLUS
INTRAVENOUS | Status: DC | PRN
  Administered 2024-03-20: 120 mg via INTRAVENOUS

## 2024-03-20 MED ORDER — ONDANSETRON HCL 4 MG/2ML IJ SOLN: 4.0000 mg | Freq: Once | INTRAMUSCULAR | Status: DC | PRN

## 2024-03-20 MED ORDER — GABAPENTIN 100 MG PO CAPS
ORAL_CAPSULE | ORAL | Status: AC
  Filled 2024-03-20: qty 1

## 2024-03-20 MED ORDER — ACETAMINOPHEN 500 MG PO TABS
ORAL_TABLET | ORAL | Status: AC
  Filled 2024-03-20: qty 2

## 2024-03-20 MED ORDER — PROPOFOL 10 MG/ML IV BOLUS
INTRAVENOUS | Status: AC
  Filled 2024-03-20: qty 20

## 2024-03-20 MED ORDER — BUPIVACAINE-EPINEPHRINE 0.25% -1:200000 IJ SOLN
INTRAMUSCULAR | Status: DC | PRN
  Administered 2024-03-20: 20 mL

## 2024-03-20 MED ORDER — SUGAMMADEX SODIUM 200 MG/2ML IV SOLN
INTRAVENOUS | Status: DC | PRN
  Administered 2024-03-20: 200 mg via INTRAVENOUS

## 2024-03-20 MED ORDER — DEXMEDETOMIDINE HCL IN NACL 80 MCG/20ML IV SOLN
INTRAVENOUS | Status: DC | PRN
  Administered 2024-03-20 (×3): 4 ug via INTRAVENOUS

## 2024-03-20 MED ORDER — EPHEDRINE 5 MG/ML INJ
INTRAVENOUS | Status: AC
  Filled 2024-03-20: qty 5

## 2024-03-20 MED ORDER — ONDANSETRON HCL 4 MG/2ML IJ SOLN
INTRAMUSCULAR | Status: DC | PRN
  Administered 2024-03-20: 4 mg via INTRAVENOUS

## 2024-03-20 MED ORDER — ACETAMINOPHEN 500 MG PO TABS
1000.0000 mg | ORAL_TABLET | ORAL | Status: AC
  Administered 2024-03-20: 1000 mg via ORAL

## 2024-03-20 NOTE — Interval H&P Note (Signed)
 History and Physical Interval Note:  03/20/2024 7:51 AM  Travis Arnold  has presented today for surgery, with the diagnosis of recurrent left inguinal hernia.  The various methods of treatment have been discussed with the patient and family. After consideration of risks, benefits and other options for treatment, the patient has consented to  Procedures with comments: REPAIR, HERNIA, INGUINAL, ADULT (Left) - RECURRENT LEFT INGUINAL HERNIA REPAIR WITH MESH as a surgical intervention.  The patient's history has been reviewed, patient examined, no change in status, stable for surgery.  I have reviewed the patient's chart and labs.  Questions were answered to the patient's satisfaction.     Deward Null III

## 2024-03-20 NOTE — Anesthesia Postprocedure Evaluation (Signed)
"   Anesthesia Post Note  Patient: Travis Arnold  Procedure(s) Performed: REPAIR, HERNIA, INGUINAL, ADULT (Left)     Patient location during evaluation: PACU Anesthesia Type: General Level of consciousness: awake and alert Pain management: pain level controlled Vital Signs Assessment: post-procedure vital signs reviewed and stable Respiratory status: spontaneous breathing, nonlabored ventilation and respiratory function stable Cardiovascular status: stable and blood pressure returned to baseline Anesthetic complications: no   No notable events documented.  Last Vitals:  Vitals:   03/20/24 1015 03/20/24 1030  BP: 109/67 111/65  Pulse: 71 71  Resp: 15 16  Temp:  36.7 C  SpO2: 92% 92%    Last Pain:  Vitals:   03/20/24 1030  TempSrc:   PainSc: 5                  Debby FORBES Like      "

## 2024-03-20 NOTE — Anesthesia Procedure Notes (Signed)
 Procedure Name: Intubation Date/Time: 03/20/2024 8:19 AM  Performed by: Kayman Snuffer D, CRNAPre-anesthesia Checklist: Patient identified, Emergency Drugs available, Suction available and Patient being monitored Patient Re-evaluated:Patient Re-evaluated prior to induction Oxygen Delivery Method: Circle system utilized Preoxygenation: Pre-oxygenation with 100% oxygen Induction Type: IV induction Ventilation: Mask ventilation without difficulty Laryngoscope Size: Mac and 4 Grade View: Grade II Tube type: Oral Tube size: 7.5 mm Number of attempts: 1 Airway Equipment and Method: Stylet and Oral airway Placement Confirmation: ETT inserted through vocal cords under direct vision, positive ETCO2 and breath sounds checked- equal and bilateral Secured at: 22 cm Tube secured with: Tape Dental Injury: Teeth and Oropharynx as per pre-operative assessment

## 2024-03-20 NOTE — Transfer of Care (Signed)
 Immediate Anesthesia Transfer of Care Note  Patient: Travis Arnold  Procedure(s) Performed: REPAIR, HERNIA, INGUINAL, ADULT (Left)  Patient Location: PACU  Anesthesia Type:General  Level of Consciousness: awake, alert , and oriented  Airway & Oxygen Therapy: Patient Spontanous Breathing and Patient connected to face mask oxygen  Post-op Assessment: Report given to RN and Post -op Vital signs reviewed and stable  Post vital signs: Reviewed and stable  Last Vitals:  Vitals Value Taken Time  BP 124/69 03/20/24 09:32  Temp    Pulse 65 03/20/24 09:34  Resp 7 03/20/24 09:34  SpO2 93 % 03/20/24 09:34  Vitals shown include unfiled device data.  Last Pain:  Vitals:   03/20/24 0710  TempSrc: Temporal  PainSc: 0-No pain         Complications: No notable events documented.

## 2024-03-20 NOTE — Op Note (Signed)
 OR Date: 03/20/2024 OR Patient Start Time:  8:30 AM  9:26 AM  PATIENT:  Travis Arnold  74 y.o. male  PRE-OPERATIVE DIAGNOSIS:  recurrent left inguinal hernia  POST-OPERATIVE DIAGNOSIS:  recurrent left inguinal hernia  PROCEDURE:  Procedures with comments: REPAIR, HERNIA, INGUINAL, ADULT (Left) - RECURRENT LEFT INGUINAL HERNIA REPAIR WITH MESH  SURGEON:  Surgeons and Role:    * Curvin Deward MOULD, MD - Primary  PHYSICIAN ASSISTANT:   ASSISTANTS: none   ANESTHESIA:   local and general  EBL:  minimal   BLOOD ADMINISTERED:none  DRAINS: none   LOCAL MEDICATIONS USED:  MARCAINE      SPECIMEN:  No Specimen  DISPOSITION OF SPECIMEN:  N/A  COUNTS:  YES  TOURNIQUET:  * No tourniquets in log *  DICTATION: .Dragon Dictation  After informed consent was obtained the patient was brought to the operating room and placed in the supine position on the operating table.  After adequate induction of general anesthesia the patient's abdomen and left groin were prepped with ChloraPrep, allowed to dry, and draped in usual sterile manner.  An appropriate timeout was performed.  There was a small bulge at the lateral aspect of the previous hernia incision.  The area around this was infiltrated with quarter percent Marcaine .  A small incision was made with a 15 blade knife through the previous hernia incision.  The incision was carried through the skin and subcutaneous tissue sharply with the electrocautery.  The subcutaneous fascial layer and external oblique fascial layer seem to be fused.  I opened this fascial layer sharply with the electrocautery.  I was able to identify a fatty bulge laterally along the old hernia repair.  This was separated out by blunt hemostat dissection.  I was able to identify the vas deferens which appeared to be calcified and scarred in.  I never really saw a defined cord with the vas.  Upon further inspection the mesh layer was identified.  It looked as if the tails of the mesh  layer had separated and that this was the site of the hernia.  The fatty bulge was able to be reduced through the circular opening of the hernia.  A small vessel lateral to the hernia defect was bleeding and was clamped with hemostats, divided, and ligated with 3-0 silk ties.  There was a space beneath this layer.  I chose a 3 x 6 piece of UltraPro mesh and cut it into thirds.  I used one third of the mesh to place beneath the old fascia.  The old fascia laterally was then reapproximated with interrupted 2-0 Prolene stitches incorporating the new mesh as an underlay.  Once this was accomplished the hernia seem well repaired.  I then closed some thickened scar tissue which could have been the remnant of the external oblique over top of this with interrupted 2-0 Prolene stitches.  The wound was then irrigated with copious amounts of saline and infiltrated with more quarter percent Marcaine .  The subcutaneous fascial layer was then reapproximated with a running 2-0 Vicryl stitch.  The skin was closed with a running 4-0 Monocryl subcuticular stitch.  Dermabond dressings were applied.  The patient tolerated the procedure well.  At the end of the case all needle sponge and instrument counts were correct.  The patient was then awakened and taken to recovery in stable condition.  PLAN OF CARE: Discharge to home after PACU  PATIENT DISPOSITION:  PACU - hemodynamically stable.   Delay start of Pharmacological  VTE agent (>24hrs) due to surgical blood loss or risk of bleeding: not applicable

## 2024-03-20 NOTE — Discharge Instructions (Signed)
May take Tylenol after 1:15pm, if needed   Post Anesthesia Home Care Instructions  Activity: Get plenty of rest for the remainder of the day. A responsible individual must stay with you for 24 hours following the procedure.  For the next 24 hours, DO NOT: -Drive a car -Advertising copywriter -Drink alcoholic beverages -Take any medication unless instructed by your physician -Make any legal decisions or sign important papers.  Meals: Start with liquid foods such as gelatin or soup. Progress to regular foods as tolerated. Avoid greasy, spicy, heavy foods. If nausea and/or vomiting occur, drink only clear liquids until the nausea and/or vomiting subsides. Call your physician if vomiting continues.  Special Instructions/Symptoms: Your throat may feel dry or sore from the anesthesia or the breathing tube placed in your throat during surgery. If this causes discomfort, gargle with warm salt water. The discomfort should disappear within 24 hours.  If you had a scopolamine patch placed behind your ear for the management of post- operative nausea and/or vomiting:  1. The medication in the patch is effective for 72 hours, after which it should be removed.  Wrap patch in a tissue and discard in the trash. Wash hands thoroughly with soap and water. 2. You may remove the patch earlier than 72 hours if you experience unpleasant side effects which may include dry mouth, dizziness or visual disturbances. 3. Avoid touching the patch. Wash your hands with soap and water after contact with the patch.

## 2024-03-20 NOTE — H&P (Signed)
 " PROVIDER: DEWARD GARNETTE NULL, MD MRN: [id number] DOB: September 30, 1950 Subjective   Chief Complaint: New Consultation  History of Present Illness: Travis Arnold is a 74 y.o. male who is seen today as an office consultation for evaluation of New Consultation  We are asked to see the patient in consultation by Dr. Dorn Sauers to evaluate him for a recurrent left inguinal hernia. The patient is a 74 year old white male who I fixed bilateral inguinal hernias on about 5 years ago. Over the last few months he has noticed a tender bulge laterally on the left side of the lower abdomen. He denies any nausea or vomiting. He denies any fevers or chills.  Review of Systems: A complete review of systems was obtained from the patient. I have reviewed this information and discussed as appropriate with the patient. See HPI as well for other ROS.  ROS   Medical History: Past Medical History:  Diagnosis Date  Arthritis  Hypertension  Sleep apnea   Patient Active Problem List  Diagnosis  Recurrent left inguinal hernia   History reviewed. No pertinent surgical history.   No Known Allergies  No current outpatient medications on file prior to visit.   No current facility-administered medications on file prior to visit.   Family History  Problem Relation Age of Onset  High blood pressure (Hypertension) Father  Hyperlipidemia (Elevated cholesterol) Father  Coronary Artery Disease (Blocked arteries around heart) Father  Coronary Artery Disease (Blocked arteries around heart) Brother  Hyperlipidemia (Elevated cholesterol) Brother  High blood pressure (Hypertension) Brother    Social History   Tobacco Use  Smoking Status Never  Smokeless Tobacco Never    Social History   Socioeconomic History  Marital status: Married  Tobacco Use  Smoking status: Never  Smokeless tobacco: Never  Substance and Sexual Activity  Alcohol use: Yes  Alcohol/week: 0.0 - 1.0 standard drinks of alcohol   Drug use: Never   Social Drivers of Health   Received from [employer]  Social Network  Housing Stability: Unknown (02/25/2024)  Housing Stability Vital Sign  Homeless in the Last Year: No   Objective:   Vitals:  BP: 122/80  Temp: 36.7 C (98 F)  Height: 182.9 cm (6')  PainSc: 0-No pain   There is no height or weight on file to calculate BMI.  Physical Exam Constitutional:  General: He is not in acute distress. Appearance: Normal appearance.  HENT:  Head: Normocephalic and atraumatic.  Right Ear: External ear normal.  Left Ear: External ear normal.  Nose: Nose normal.  Mouth/Throat:  Mouth: Mucous membranes are moist.  Pharynx: Oropharynx is clear.  Eyes:  General: No scleral icterus. Extraocular Movements: Extraocular movements intact.  Conjunctiva/sclera: Conjunctivae normal.  Pupils: Pupils are equal, round, and reactive to light.  Cardiovascular:  Rate and Rhythm: Normal rate and regular rhythm.  Pulses: Normal pulses.  Heart sounds: Normal heart sounds.  Pulmonary:  Effort: Pulmonary effort is normal. No respiratory distress.  Breath sounds: Normal breath sounds.  Abdominal:  General: Abdomen is flat. Bowel sounds are normal. There is no distension.  Palpations: Abdomen is soft.  Tenderness: There is no abdominal tenderness.  Genitourinary: Comments: There is a small reducible bulge that is tender along the lateral aspect of the old hernia incision. There is no palpable bulge along the right side Musculoskeletal:  General: No swelling or deformity. Normal range of motion.  Cervical back: Normal range of motion and neck supple. No tenderness.  Skin: General: Skin is warm and  dry.  Coloration: Skin is not jaundiced.  Neurological:  General: No focal deficit present.  Mental Status: He is alert and oriented to person, place, and time.  Psychiatric:  Mood and Affect: Mood normal.  Behavior: Behavior normal.     Labs, Imaging and Diagnostic  Testing:  Assessment and Plan:   Diagnoses and all orders for this visit:  Recurrent left inguinal hernia - [facility] Case Posting Request; Future   The patient appears to have a small recurrence of his left inguinal hernia repair laterally. Because of the risk of incarceration and strangulation I feel he would benefit from having this fixed. He would also like to have this done. I have discussed with him in detail the risks and benefits of the operation as well as some of the technical aspects including the use of mesh and the risk of chronic pain and he understands and wishes to proceed.  "

## 2025-03-18 ENCOUNTER — Encounter (INDEPENDENT_AMBULATORY_CARE_PROVIDER_SITE_OTHER): Admitting: Ophthalmology

## 2025-04-20 ENCOUNTER — Encounter (INDEPENDENT_AMBULATORY_CARE_PROVIDER_SITE_OTHER): Admitting: Ophthalmology
